# Patient Record
Sex: Male | Born: 1980
Health system: Southern US, Community
[De-identification: ages and names within clinical notes are randomized; demographics above are authoritative.]

## PROBLEM LIST (undated history)

## (undated) DIAGNOSIS — F411 Generalized anxiety disorder: Secondary | ICD-10-CM

## (undated) DIAGNOSIS — R002 Palpitations: Secondary | ICD-10-CM

## (undated) HISTORY — PX: OTHER SURGICAL HISTORY: SHX169

## (undated) HISTORY — DX: Generalized anxiety disorder: F41.1

## (undated) HISTORY — DX: Palpitations: R00.2

---

## 2008-08-27 ENCOUNTER — Encounter: Admission: RE | Admit: 2008-08-27 | Discharge: 2008-08-27 | Payer: Self-pay | Admitting: Occupational Medicine

## 2012-01-27 ENCOUNTER — Encounter (HOSPITAL_COMMUNITY): Payer: Self-pay | Admitting: *Deleted

## 2012-01-27 ENCOUNTER — Emergency Department (HOSPITAL_COMMUNITY)
Admission: EM | Admit: 2012-01-27 | Discharge: 2012-01-27 | Disposition: A | Discharge status: Home / Self Care | Payer: 59 | Attending: Emergency Medicine | Admitting: Emergency Medicine

## 2012-01-27 DIAGNOSIS — R110 Nausea: Secondary | ICD-10-CM | POA: Insufficient documentation

## 2012-01-27 DIAGNOSIS — R002 Palpitations: Secondary | ICD-10-CM | POA: Insufficient documentation

## 2012-01-27 LAB — POCT I-STAT, CHEM 8
BUN: 12 mg/dL (ref 6–23)
Calcium, Ion: 1.27 mmol/L — ABNORMAL HIGH (ref 1.12–1.23)
Chloride: 105 mEq/L (ref 96–112)
HCT: 49 % (ref 39.0–52.0)
Sodium: 140 mEq/L (ref 135–145)
TCO2: 22 mmol/L (ref 0–100)

## 2012-01-27 MED ORDER — ONDANSETRON HCL 4 MG/2ML IJ SOLN
4.0000 mg | Freq: Once | INTRAMUSCULAR | Status: AC
  Administered 2012-01-27: 4 mg via INTRAVENOUS
  Filled 2012-01-27: qty 2

## 2012-01-27 MED ORDER — SODIUM CHLORIDE 0.9 % IV BOLUS (SEPSIS)
1000.0000 mL | Freq: Once | INTRAVENOUS | Status: AC
  Administered 2012-01-27: 1000 mL via INTRAVENOUS

## 2012-01-27 NOTE — ED Notes (Signed)
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

## 2012-01-27 NOTE — ED Provider Notes (Addendum)
History     CSN: [id number]  Arrival date & time 01/27/12  1027   First MD Initiated Contact with Patient 01/27/12 1112      Chief Complaint  Patient presents with  . Palpitations  . Nausea    (Consider location/radiation/quality/duration/timing/severity/associated sxs/prior treatment) Patient is a 31 y.o. male presenting with palpitations. The history is provided by the patient.  Palpitations  Pertinent negatives include no fever, no chest pain, no abdominal pain, no nausea, no vomiting, no dizziness, no cough and no shortness of breath.   31 year old, male, with no significant past medical history presents to emergency department complaining of palpitations, and nausea.  He denies pain anywhere.  He denies actual vomiting.  He has not had diarrhea.  He has a mild increase in his temperature.  He denies cough, or shortness of breath.  He denies rash.  He does not smoke.  He does drink a lot of caffeinated beverages.  He occasionally drinks alcohol.  He is not taking any decongestants or other medications  History reviewed. No pertinent past medical history.  History reviewed. No pertinent past surgical history.  No family history on file.  History  Substance Use Topics  . Smoking status: Passive Smoker    Types: Pipe  . Smokeless tobacco: Not on file  . Alcohol Use: Yes     ocassionally      Review of Systems  Constitutional: Negative for fever and chills.  HENT: Negative for congestion and sore throat.   Respiratory: Negative for cough and shortness of breath.   Cardiovascular: Positive for palpitations. Negative for chest pain and leg swelling.  Gastrointestinal: Negative for nausea, vomiting, abdominal pain and diarrhea.  Skin: Negative for rash.  Neurological: Negative for dizziness and light-headedness.  Psychiatric/Behavioral: Negative for confusion.  All other systems reviewed and are negative.    Allergies  Review of patient's allergies indicates no known  allergies.  Home Medications   Current Outpatient Rx  Name Route Sig Dispense Refill  . CALCIUM CARBONATE ANTACID 500 MG PO CHEW Oral Chew 3 tablets by mouth 2 (two) times daily.      BP 117/86  Pulse 70  Temp 99.1 F (37.3 C)  Resp 20  Wt 270 lb (122.471 kg)  SpO2 96%  Physical Exam  Nursing note and vitals reviewed. Constitutional: He is oriented to person, place, and time. He appears well-developed and well-nourished. No distress.  HENT:  Head: Normocephalic and atraumatic.  Eyes: Conjunctivae are normal.  Neck: Normal range of motion. Neck supple.  Cardiovascular: Normal rate.   No murmur heard. Pulmonary/Chest: Effort normal and breath sounds normal. No respiratory distress.  Abdominal: Soft. He exhibits no distension. There is no tenderness.  Musculoskeletal: Normal range of motion.  Neurological: He is alert and oriented to person, place, and time.  Skin: Skin is warm and dry.  Psychiatric: He has a normal mood and affect. Thought content normal.    ED Course  Procedures (including critical care time) 31 year old, male, with no past medical history presents with palpitations, and nausea.  He drinks a lot of caffeinated beverages.  He takes no other medications.  Does not smoke.  His physical examination is normal.  We'll perform an EKG, and laboratory testing, for evaluation.  Labs Reviewed - No data to display No results found.   No diagnosis found.  12:23 PM He has not had palpitations in the ER, since he's been here.  He, says he just feels tired.  I explained the  findings/an EKG, recently.    Rate: 82  Rhythm: normal sinus rhythm  QRS Axis: normal  Intervals: normal  ST/T Wave abnormalities: normal  Conduction Disutrbances: none  Narrative Interpretation: unremarkable     MDM  Palpitations        Cheri Guppy, MD 01/27/12 1133  Cheri Guppy, MD 01/27/12 1223  Cheri Guppy, MD 02/23/12 [phone]

## 2012-01-28 ENCOUNTER — Telehealth: Payer: Self-pay | Admitting: Cardiovascular Disease

## 2012-01-28 NOTE — Telephone Encounter (Signed)
Pt seen in er last nigh for a-fib, says dc papers states to be seen asap, first available is 02-11-12 with dr Clifton James, is this too late?

## 2012-01-28 NOTE — Telephone Encounter (Signed)
02/11/2012 is fine.  His d/c instructions just said to follow-up with cardiologist.  His ER workup was normal.

## 2012-01-31 ENCOUNTER — Emergency Department (HOSPITAL_COMMUNITY)
Admission: EM | Admit: 2012-01-31 | Discharge: 2012-01-31 | Disposition: A | Discharge status: Home / Self Care | Payer: 59 | Attending: Emergency Medicine | Admitting: Emergency Medicine

## 2012-01-31 ENCOUNTER — Encounter (HOSPITAL_COMMUNITY): Payer: Self-pay | Admitting: Emergency Medicine

## 2012-01-31 ENCOUNTER — Emergency Department (HOSPITAL_COMMUNITY): Payer: 59

## 2012-01-31 DIAGNOSIS — F17200 Nicotine dependence, unspecified, uncomplicated: Secondary | ICD-10-CM | POA: Insufficient documentation

## 2012-01-31 DIAGNOSIS — R002 Palpitations: Secondary | ICD-10-CM | POA: Insufficient documentation

## 2012-01-31 HISTORY — PX: TRANSTHORACIC ECHOCARDIOGRAM: SHX275

## 2012-01-31 LAB — POCT I-STAT, CHEM 8
BUN: 17 mg/dL (ref 6–23)
Calcium, Ion: 1.2 mmol/L (ref 1.12–1.23)
Chloride: 104 mEq/L (ref 96–112)
HCT: 49 % (ref 39.0–52.0)
Potassium: 4.2 mEq/L (ref 3.5–5.1)

## 2012-01-31 LAB — CBC
HCT: 46.8 % (ref 39.0–52.0)
MCV: 88 fL (ref 78.0–100.0)
Platelets: 242 10*3/uL (ref 150–400)
RBC: 5.32 MIL/uL (ref 4.22–5.81)
RDW: 12.4 % (ref 11.5–15.5)
WBC: 8.2 10*3/uL (ref 4.0–10.5)

## 2012-01-31 LAB — POCT I-STAT TROPONIN I: Troponin i, poc: 0 ng/mL (ref 0.00–0.08)

## 2012-01-31 NOTE — ED Provider Notes (Signed)
History     CSN: [id number]  Arrival date & time 01/31/12  1747   First MD Initiated Contact with Patient 01/31/12 1845      Chief Complaint  Patient presents with  . Irregular Heart Beat    (Consider location/radiation/quality/duration/timing/severity/associated sxs/prior treatment) The history is provided by the patient.   patient is a healthy 31 year old male who presents the emergency room with a chief complaint of palpitations. This occurs nightly and last for several minutes. He was evaluated in the emergency department for this complaint several days ago with no acute findings and was advised to followup with a cardiologist but their next appointment is not until August 12. Today, he began to feel fluttering in the left side of his chest associated with weakness while at work. He came inside and sat down with some improvement but reports on arrival to the emergency department that he still feels a funny feeling on the left side of his chest. He denies that this is a pain. He denies any shortness of breath. He denies any nausea or vomiting. He denies any fever, chills, cough. Denies any modifying factors. No personal or known family history of DVT or PE. He is not a daily smoker. No recent prolonged immobility, surgery, travel. No pain or swelling to lower extremities. No early family history of coronary disease, though his mother does have a history of atrial fibrillation.  History reviewed. No pertinent past medical history.  History reviewed. No pertinent past surgical history.  No family history on file.  History  Substance Use Topics  . Smoking status: Passive Smoker    Types: Pipe  . Smokeless tobacco: Not on file  . Alcohol Use: Yes     ocassionally      Review of Systems 10 systems reviewed and are negative for acute change except as noted in the HPI.  Allergies  Review of patient's allergies indicates no known allergies.  Home Medications   Current Outpatient  Rx  Name Route Sig Dispense Refill  . CALCIUM CARBONATE ANTACID 500 MG PO CHEW Oral Chew 3 tablets by mouth 2 (two) times daily.      BP 132/65  Temp 98.2 F (36.8 C) (Oral)  Resp 15  SpO2 96%  Physical Exam  Nursing note and vitals reviewed. Constitutional: He is oriented to person, place, and time. He appears well-developed and well-nourished. No distress.  HENT:  Head: Normocephalic and atraumatic.  Right Ear: External ear normal.  Left Ear: External ear normal.       Oral mucosa moist  Eyes: Conjunctivae are normal.  Neck: Neck supple.       No carotid bruit heard  Cardiovascular: Normal rate, regular rhythm and normal heart sounds.        Bilateral radial pulses 2+  Pulmonary/Chest: Effort normal and breath sounds normal. No respiratory distress. He has no wheezes. He exhibits tenderness (over the left upper chest wall).  Abdominal: Soft. Bowel sounds are normal. He exhibits no distension. There is no tenderness.  Musculoskeletal: He exhibits no edema and no tenderness.  Neurological: He is alert and oriented to person, place, and time.       Moves all extremities well with preserved strength in major muscle groups, grip strength 5 out of 5. Speech is clear and appropriate.  Skin: Skin is warm and dry. He is not diaphoretic.    ED Course  Procedures (including critical care time)   Labs Reviewed  CBC  POCT I-STAT, CHEM 8  POCT I-STAT TROPONIN I   Dg Chest 2 View  01/31/2012  *RADIOLOGY REPORT*  Clinical Data: Palpitations and weakness.  CHEST - 2 VIEW  Comparison: 08/27/2008  Findings: Heart size is normal.  The lungs are free of focal consolidations and pleural effusions.  No edema. Visualized osseous structures have a normal appearance.  IMPRESSION: Negative exam.  Original Report Authenticated By: Patterson Hammersmith, M.D.    Date: 01/31/2012  Rate: 81  Rhythm: normal sinus rhythm  QRS Axis: normal  Intervals: normal  ST/T Wave abnormalities: normal  Conduction  Disutrbances: none  Old EKG Reviewed: compared to 01/27/12, No significant changes noted     Dx 1: palpitations   MDM  Palpitations, recurrent. 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 Given patient concerns, I have expanded his testing to include a chest x-ray, troponin, CBC and orthostatic vital signs. I-STAT chemistry panel and EKG are also repeated. EKG is unchanged from several days ago. Troponin is negative. CBC and i-STAT are unremarkable. Chest x-ray shows no acute findings. No episodes of palpitations in the emergency department and patient feels well on my reassessment. He will be discharged home. We discussed return precautions and he is to follow up with cardiologist as previously planned. He'll try an NSAID for his pain as there was tenderness to palpation of the chest wall.        Shaaron Adler, [place] 01/31/12 2032

## 2012-01-31 NOTE — ED Notes (Signed)
Today while at work started feeling "fluttering"in chest and "sick feeling" in left chest. States this has happened to him each night when going to bed. Was here last Sunday for same. Denies SOB. Denies nausea or vomiting.

## 2012-02-01 NOTE — ED Provider Notes (Signed)
Medical screening examination/treatment/procedure(s) were performed by non-physician practitioner and as supervising physician I was immediately available for consultation/collaboration.  Noami Bove, MD 02/01/12 0145 

## 2012-02-11 ENCOUNTER — Ambulatory Visit (INDEPENDENT_AMBULATORY_CARE_PROVIDER_SITE_OTHER): Payer: 59 | Admitting: Cardiovascular Disease

## 2012-02-11 ENCOUNTER — Encounter: Payer: Self-pay | Admitting: Cardiovascular Disease

## 2012-02-11 VITALS — BP 128/79 | HR 78 | Ht 74.0 in | Wt 272.0 lb

## 2012-02-11 DIAGNOSIS — R002 Palpitations: Secondary | ICD-10-CM | POA: Insufficient documentation

## 2012-02-11 NOTE — Assessment & Plan Note (Signed)
Likely premature beats. Will check TSH (BMET ok last week), echo to exclude structural heart disease and have him wear a 48 hour monitor. Avoid stimulants such as caffeine, nicotine, over the counter stimulants.

## 2012-02-11 NOTE — Patient Instructions (Addendum)
Your physician recommends that you schedule a follow-up appointment in: 3 weeks   Your physician has requested that you have an echocardiogram. Echocardiography is a painless test that uses sound waves to create images of your heart. It provides your doctor with information about the size and shape of your heart and how well your heart's chambers and valves are working. This procedure takes approximately one hour. There are no restrictions for this procedure.  Your physician has recommended that you wear a holter monitor. Holter monitors are medical devices that record the heart's electrical activity. Doctors most often use these monitors to diagnose arrhythmias. Arrhythmias are problems with the speed or rhythm of the heartbeat. The monitor is a small, portable device. You can wear one while you do your normal daily activities. This is usually used to diagnose what is causing palpitations/syncope (passing out).    

## 2012-02-11 NOTE — Progress Notes (Signed)
   History of Present Illness: 31 yo male with history of palpitations but no chronic medical problems who is here today as a new patient for evaluation of palpitations. He tells me that 1 month ago, he felt his heart fluttering. This is a funny sensation in his upper chest. This lasted for a few minutes. 3 weeks ago, he had the same feeling and this lasted all night. He had no stimulants during the day. He has had recurrence of symptoms every night over the last week. No near syncope or syncope. No chest pain or affect on breathing. He is a Chemical engineer.   Primary Care Physician: None  Past Medical History  Diagnosis Date  . Palpitations     Past Surgical History  Procedure Date  . None     No current outpatient prescriptions on file.    No Known Allergies  History   Social History  . Marital Status: Unknown    Spouse Name: N/A    Number of Children: 0  . Years of Education: N/A   Occupational History  . [employer]   Social History Main Topics  . Smoking status: Passive Smoker    Types: Pipe  . Smokeless tobacco: Not on file   Comment: pt occ smokes a pipe maybe once or twice a month  . Alcohol Use: 2.5 oz/week    5 drink(s) per week     ocassionally  . Drug Use: No  . Sexually Active: Not on file   Other Topics Concern  . Not on file   Social History Narrative  . No narrative on file    Family History  Problem Relation Age of Onset  . Cancer Father     Lung cancer    Review of Systems:  As stated in the HPI and otherwise negative.   BP 128/79  Pulse 78  Ht 6\' 2"  (1.88 m)  Wt 272 lb (123.378 kg)  BMI 34.92 kg/m2  Physical Examination: General: Well developed, well nourished, NAD HEENT: OP clear, mucus membranes moist SKIN: warm, dry. No rashes. Neuro: No focal deficits Musculoskeletal: Muscle strength 5/5 all ext Psychiatric: Mood and affect normal Neck: No JVD, no carotid bruits, no thyromegaly, no  lymphadenopathy. Lungs:Clear bilaterally, no wheezes, rhonci, crackles Cardiovascular: Regular rate and rhythm. No murmurs, gallops or rubs. Abdomen:Soft. Bowel sounds present. Non-tender.  Extremities: No lower extremity edema. Pulses are 2 + in the bilateral DP/PT.  EKG: NSR, rate 78 bpm. Normal EKG

## 2012-02-13 ENCOUNTER — Other Ambulatory Visit (HOSPITAL_COMMUNITY): Payer: 59

## 2012-02-15 ENCOUNTER — Encounter: Payer: Self-pay | Admitting: *Deleted

## 2012-02-15 ENCOUNTER — Ambulatory Visit (HOSPITAL_COMMUNITY): Payer: 59 | Attending: Cardiovascular Disease | Admitting: Radiology

## 2012-02-15 ENCOUNTER — Encounter (INDEPENDENT_AMBULATORY_CARE_PROVIDER_SITE_OTHER): Payer: 59

## 2012-02-15 DIAGNOSIS — R002 Palpitations: Secondary | ICD-10-CM

## 2012-02-15 DIAGNOSIS — I079 Rheumatic tricuspid valve disease, unspecified: Secondary | ICD-10-CM | POA: Insufficient documentation

## 2012-02-15 DIAGNOSIS — I059 Rheumatic mitral valve disease, unspecified: Secondary | ICD-10-CM | POA: Insufficient documentation

## 2012-02-15 NOTE — Progress Notes (Signed)
Echocardiogram performed.  

## 2012-03-04 ENCOUNTER — Ambulatory Visit (INDEPENDENT_AMBULATORY_CARE_PROVIDER_SITE_OTHER): Payer: 59 | Admitting: Cardiovascular Disease

## 2012-03-04 ENCOUNTER — Encounter: Payer: Self-pay | Admitting: Cardiovascular Disease

## 2012-03-04 VITALS — BP 122/84 | HR 78 | Resp 19 | Ht 74.0 in | Wt 277.4 lb

## 2012-03-04 DIAGNOSIS — R002 Palpitations: Secondary | ICD-10-CM

## 2012-03-04 NOTE — Progress Notes (Signed)
   History of Present Illness: 31 yo male with history of palpitations but no chronic medical problems who is here today for cardiac follow up. I saw him as a new patient for evaluation of palpitations 02/11/12. He told me that he had been feeling his heart fluttering for one month.  This lasted for a few minutes.  No near syncope or syncope. No chest pain or affect on breathing. He is a Chemical engineer. I arranged a 48 hour monitor which showed NSR with PACs and several episodes of Mobitz 1 AV block.    He has been feeling better. Occasional palpitations. No sustained episodes. No syncope or near syncope.   Primary Care Physician: None  Past Medical History  Diagnosis Date  . Palpitations     Past Surgical History  Procedure Date  . None     No current outpatient prescriptions on file.    No Known Allergies  History   Social History  . Marital Status: Unknown    Spouse Name: N/A    Number of Children: 0  . Years of Education: N/A   Occupational History  . [employer]   Social History Main Topics  . Smoking status: Passive Smoker    Types: Pipe  . Smokeless tobacco: Not on file   Comment: pt occ smokes a pipe maybe once or twice a month  . Alcohol Use: 2.5 oz/week    5 drink(s) per week     ocassionally  . Drug Use: No  . Sexually Active: Not on file   Other Topics Concern  . Not on file   Social History Narrative  . No narrative on file    Family History  Problem Relation Age of Onset  . Cancer Father     Lung cancer    Review of Systems:  As stated in the HPI and otherwise negative.   There were no vitals taken for this visit.  Physical Examination: General: Well developed, well nourished, NAD HEENT: OP clear, mucus membranes moist SKIN: warm, dry. No rashes. Neuro: No focal deficits Musculoskeletal: Muscle strength 5/5 all ext Psychiatric: Mood and affect normal Neck: No JVD, no carotid bruits, no thyromegaly, no  lymphadenopathy. Lungs:Clear bilaterally, no wheezes, rhonci, crackles Cardiovascular: Regular rate and rhythm. No murmurs, gallops or rubs. Abdomen:Soft. Bowel sounds present. Non-tender.  Extremities: No lower extremity edema. Pulses are 2 + in the bilateral DP/PT.  Echo 02/15/12: Left ventricle: The cavity size was normal. Wall thickness was normal. Systolic function was normal. The estimated ejection fraction was in the range of 55% to 60%. Features are consistent with a pseudonormal left ventricular filling pattern, with concomitant abnormal relaxation and increased filling pressure (grade 2 diastolic dysfunction)   Assessment and Plan:   1. Palpitations: Most likely secondary to PACs. I do not think his symptoms are related to his AV block. The AV block is isolated.  LV function is normal. No further workup.

## 2012-03-04 NOTE — Patient Instructions (Addendum)
Your physician recommends that you schedule a follow-up appointment as needed with Dr. McAlhany   

## 2013-04-20 ENCOUNTER — Telehealth: Payer: Self-pay

## 2013-04-20 NOTE — Telephone Encounter (Signed)
Medication List and allergies: reviewed  Takes no Rx medications at this time  90 day supply/mail order: none Local prescriptions: [facility] [address]  Immunizations due: flu, tdap declined  A/P:    ROI to [facility] [address] [place]  To Discuss with Provider: Patient has lots of thing he would like to tell the provider Wants to do in person since its a long story.

## 2013-04-22 ENCOUNTER — Ambulatory Visit (INDEPENDENT_AMBULATORY_CARE_PROVIDER_SITE_OTHER): Payer: 59 | Admitting: Family Medicine

## 2013-04-22 ENCOUNTER — Encounter: Payer: Self-pay | Admitting: Family Medicine

## 2013-04-22 VITALS — BP 122/80 | HR 77 | Temp 98.5°F | Ht 73.5 in | Wt 270.6 lb

## 2013-04-22 DIAGNOSIS — M546 Pain in thoracic spine: Secondary | ICD-10-CM

## 2013-04-22 MED ORDER — CELECOXIB 200 MG PO CAPS: 200.0000 mg | ORAL_CAPSULE | Freq: Every day | ORAL | Status: DC

## 2013-04-22 NOTE — Patient Instructions (Signed)
Thoracic Strain  You have injured the muscles or tendons that attach to the upper part of your back behind your chest. This injury is called a thoracic strain, thoracic sprain, or mid-back strain.   CAUSES   The cause of thoracic strain varies. A less severe injury involves pulling a muscle or tendon without tearing it. A more severe injury involves tearing (rupturing) a muscle or tendon. With less severe injuries, there may be little loss of strength. Sometimes, there are breaks (fractures) in the bones to which the muscles are attached. These fractures are rare, unless there was a direct hit (trauma) or you have weak bones due to osteoporosis or age. Longstanding strains may be caused by overuse or improper form during certain movements. Obesity can also increase your risk for back injuries. Sudden strains may occur due to injury or not warming up properly before exercise. Often, there is no obvious cause for a thoracic strain.  SYMPTOMS   The main symptom is pain, especially with movement, such as during exercise.  DIAGNOSIS   Your caregiver can usually tell what is wrong by taking an X-ray and doing a physical exam.  TREATMENT    Physical therapy may be helpful for recovery. Your caregiver can give you exercises to do or refer you to a physical therapist after your pain improves.   After your pain improves, strengthening and conditioning programs appropriate for your sport or occupation may be helpful.   Always warm up before physical activities or athletics. Stretching after physical activity may also help.   Certain over-the-counter medicines may also help. Ask your caregiver if there are medicines that would help you.  If this is your first thoracic strain injury, proper care and proper healing time before starting activities should prevent long-term problems. Torn ligaments and tendons require as long to heal as broken bones. Average healing times may be only 1 week for a mild strain. For torn muscles  and tendons, healing time may be up to 6 weeks to 2 months.  HOME CARE INSTRUCTIONS    Apply ice to the injured area. Ice massages may also be used as directed.   Put ice in a plastic bag.   Place a towel between your skin and the bag.   Leave the ice on for 15-20 minutes, 3-4 times a day, for the first 2 days.   Only take over-the-counter or prescription medicines for pain, discomfort, or fever as directed by your caregiver.   Keep your appointments for physical therapy if this was prescribed.   Use wraps and back braces as instructed.  SEEK IMMEDIATE MEDICAL CARE IF:    You have an increase in bruising, swelling, or pain.   Your pain has not improved with medicines.   You develop new shortness of breath, chest pain, or fever.   Problems seem to be getting worse rather than better.  MAKE SURE YOU:    Understand these instructions.   Will watch your condition.   Will get help right away if you are not doing well or get worse.  Document Released: 09/08/2003 Document Revised: 09/10/2011 Document Reviewed: 08/04/2010  Exit[employer] Patient Information 2014 [employer].

## 2013-04-22 NOTE — Progress Notes (Signed)
  Subjective:    Levi Berger is a 32 y.o. male who presents for evaluation of low back pain. The patient has had no prior back problems. Symptoms have been present for several months and are gradually worsening.  Onset was related to / precipitated by no known injury. The pain is located in the interscapular area and does not radiate. The pain is described as aching and sharp and occurs all day. He rates his pain as severe. Symptoms are exacerbated by lying down, sitting and twisting. Symptoms are improved by ice and NSAIDs. He has also tried nothing which provided no symptom relief. He has no other symptoms associated with the back pain. The patient has no "red flag" history indicative of complicated back pain. Pt has been to UC 2 x and chiropractor.  The following portions of the patient's history were reviewed and updated as appropriate:  He  has a past medical history of Palpitations. He  does not have any pertinent problems on file. He  has past surgical history that includes None. His family history includes Alcohol abuse in his maternal grandfather; Arthritis in his paternal grandfather; Colon cancer in his paternal grandfather; Diabetes in his father and paternal grandfather; Heart disease in his maternal grandfather; Hypertension in his father; Lung cancer in his father. He  reports that he has been passively smoking Pipe.  He does not have any smokeless tobacco history on file. He reports that he drinks about 2.5 ounces of alcohol per week. He reports that he does not use illicit drugs. He has a current medication list which includes the following prescription(s): celecoxib. No current outpatient prescriptions on file prior to visit.   No current facility-administered medications on file prior to visit.   He has No Known Allergies..  Review of Systems Pertinent items are noted in HPI.    Objective:   Inspection and palpation: scoliosis noted right scapula raised when pt bends  forward. Muscle tone and ROM exam: full range of motion with pain. Neurological: normal DTRs, muscle strength and reflexes.    Assessment:    thoracic back pain-- + scoliosis    Plan:    Short (2-4 day) period of relative rest recommended until acute symptoms improve. Ice to affected area as needed for local pain relief. Heat to affected area as needed for local pain relief. NSAIDs per medication orders. Orthopedic referral due to worsening of pain.

## 2014-06-02 ENCOUNTER — Ambulatory Visit (INDEPENDENT_AMBULATORY_CARE_PROVIDER_SITE_OTHER): Payer: 59 | Admitting: Medical

## 2014-06-02 VITALS — BP 135/91 | HR 77 | Temp 99.2°F | Ht 73.5 in | Wt 274.2 lb

## 2014-06-02 DIAGNOSIS — M5432 Sciatica, left side: Secondary | ICD-10-CM

## 2014-06-02 DIAGNOSIS — M5430 Sciatica, unspecified side: Secondary | ICD-10-CM | POA: Insufficient documentation

## 2014-06-02 MED ORDER — DICLOFENAC SODIUM 75 MG PO TBEC: 75.0000 mg | DELAYED_RELEASE_TABLET | Freq: Two times a day (BID) | ORAL | Status: DC

## 2014-06-02 MED ORDER — CYCLOBENZAPRINE HCL 5 MG PO TABS: 5.0000 mg | ORAL_TABLET | Freq: Every day | ORAL | Status: DC

## 2014-06-02 NOTE — Assessment & Plan Note (Addendum)
I have sent diclofenac nsaid(dc other rx nsaids or otc nsaids) and  Take cyclobenzaprene muscle relaxant. Try to rest and stretch back.  If pain persist in one week notify us and may give prednisone and get lumbar xray.  If we have to try prednisone and still not improving would refer to PT.  Note  Lt hip- on range of motion of hip and palpation over hip itelf no pain of the hip.

## 2014-06-02 NOTE — Patient Instructions (Addendum)
I have sent diclofenac nsaid(dc other rx nsaids or otc nsaids) and cyclobenzaprene muscle relaxant. Try to rest and stretch back.  If pain persist in one week notify us and may give prednisone and get lumbar xray.  If we have to try prednisone and still not improving would refer to PT.  Follow up in 10-14 days or as needed.   Sciatica Sciatica is pain, weakness, numbness, or tingling along the path of the sciatic nerve. The nerve starts in the lower back and runs down the back of each leg. The nerve controls the muscles in the lower leg and in the back of the knee, while also providing sensation to the back of the thigh, lower leg, and the sole of your foot. Sciatica is a symptom of another medical condition. For instance, nerve damage or certain conditions, such as a herniated disk or bone spur on the spine, pinch or put pressure on the sciatic nerve. This causes the pain, weakness, or other sensations normally associated with sciatica. Generally, sciatica only affects one side of the body. CAUSES   Herniated or slipped disc.  Degenerative disk disease.  A pain disorder involving the narrow muscle in the buttocks (piriformis syndrome).  Pelvic injury or fracture.  Pregnancy.  Tumor (rare). SYMPTOMS  Symptoms can vary from mild to very severe. The symptoms usually travel from the low back to the buttocks and down the back of the leg. Symptoms can include:  Mild tingling or dull aches in the lower back, leg, or hip.  Numbness in the back of the calf or sole of the foot.  Burning sensations in the lower back, leg, or hip.  Sharp pains in the lower back, leg, or hip.  Leg weakness.  Severe back pain inhibiting movement. These symptoms may get worse with coughing, sneezing, laughing, or prolonged sitting or standing. Also, being overweight may worsen symptoms. DIAGNOSIS  Your caregiver will perform a physical exam to look for common symptoms of sciatica. He or she may ask you to  do certain movements or activities that would trigger sciatic nerve pain. Other tests may be performed to find the cause of the sciatica. These may include:  Blood tests.  X-rays.  Imaging tests, such as an MRI or CT scan. TREATMENT  Treatment is directed at the cause of the sciatic pain. Sometimes, treatment is not necessary and the pain and discomfort goes away on its own. If treatment is needed, your caregiver may suggest:  Over-the-counter medicines to relieve pain.  Prescription medicines, such as anti-inflammatory medicine, muscle relaxants, or narcotics.  Applying heat or ice to the painful area.  Steroid injections to lessen pain, irritation, and inflammation around the nerve.  Reducing activity during periods of pain.  Exercising and stretching to strengthen your abdomen and improve flexibility of your spine. Your caregiver may suggest losing weight if the extra weight makes the back pain worse.  Physical therapy.  Surgery to eliminate what is pressing or pinching the nerve, such as a bone spur or part of a herniated disk. HOME CARE INSTRUCTIONS   Only take over-the-counter or prescription medicines for pain or discomfort as directed by your caregiver.  Apply ice to the affected area for 20 minutes, 3-4 times a day for the first 48-72 hours. Then try heat in the same way.  Exercise, stretch, or perform your usual activities if these do not aggravate your pain.  Attend physical therapy sessions as directed by your caregiver.  Keep all follow-up appointments as directed  by your caregiver.  Do not wear high heels or shoes that do not provide proper support.  Check your mattress to see if it is too soft. A firm mattress may lessen your pain and discomfort. SEEK IMMEDIATE MEDICAL CARE IF:   You lose control of your bowel or bladder (incontinence).  You have increasing weakness in the lower back, pelvis, buttocks, or legs.  You have redness or swelling of your  back.  You have a burning sensation when you urinate.  You have pain that gets worse when you lie down or awakens you at night.  Your pain is worse than you have experienced in the past.  Your pain is lasting longer than 4 weeks.  You are suddenly losing weight without reason. MAKE SURE YOU:  Understand these instructions.  Will watch your condition.  Will get help right away if you are not doing well or get worse. Document Released: 06/12/2001 Document Revised: 12/18/2011 Document Reviewed: 10/28/2011 [facility] Patient Information 2015 [place] [place]. This information is not intended to replace advice given to you by your health care provider. Make sure you discuss any questions you have with your health care provider.

## 2014-06-02 NOTE — Progress Notes (Signed)
Pre visit review using our clinic review tool, if applicable. No additional management support is needed unless otherwise documented below in the visit note. 

## 2014-06-02 NOTE — Progress Notes (Signed)
Subjective:    Patient ID: Levi Berger, male    DOB: [birth year], 33 y.o.   MRN: [id number]  HPI   Pt in with Lt si pain. Pain on the left side. Pt states started 2 months ago. Mild at first but getting worse. Position dependent and with activity. Worse with walking down pain is worse. Standing in place and shifting quickly will cause. No pain prior to 2 months ago. Seated no pain. Pt takes ibupofen for pain. It does not help much.  Pt has some upper back pain history. Pt had work up for his upper back before. He had mri in the past. He is not here for the upper back. But he explained history upon my inquiring.  Pt has no  Mid lumbar back pain. Note on physical exam area of pain is in lt si area although he states hip. But on exam no hip pain.  Past Medical History  Diagnosis Date  . Palpitations     History   Social History  . Marital Status: Unknown    Spouse Name: N/A    Number of Children: 0  . Years of Education: N/A   Occupational History  . [employer] Unemployed   Social History Main Topics  . Smoking status: Passive Smoke Exposure - Never Smoker    Types: Pipe  . Smokeless tobacco: Not on file     Comment: pt occ smokes a pipe maybe once or twice a month  . Alcohol Use: 2.5 oz/week    5 drink(s) per week     Comment: ocassionally  . Drug Use: No  . Sexual Activity: Not on file   Other Topics Concern  . Not on file   Social History Narrative  . No narrative on file    Past Surgical History  Procedure Laterality Date  . None      Family History  Problem Relation Age of Onset  . Lung cancer Father     Smoker  . Hypertension Father   . Arthritis Paternal Grandfather   . Colon cancer Paternal Grandfather   . Heart disease Maternal Grandfather   . Alcohol abuse Maternal Grandfather   . Diabetes Father   . Diabetes Paternal Grandfather     No Known Allergies  Current Outpatient Prescriptions on File Prior to Visit  Medication Sig Dispense  Refill  . celecoxib (CELEBREX) 200 MG capsule Take 1 capsule (200 mg total) by mouth daily. 30 capsule 3   No current facility-administered medications on file prior to visit.    BP 135/91 mmHg  Pulse 77  Temp(Src) 99.2 F (37.3 C) (Oral)  Ht 6' 1.5" (1.867 m)  Wt 274 lb 3.2 oz (124.376 kg)  BMI 35.68 kg/m2  SpO2 97%       Review of Systems  Constitutional: Negative for fever, chills and fatigue.  Respiratory: Negative for cough, shortness of breath and wheezing.   Genitourinary: Negative for urgency, frequency, hematuria, flank pain, enuresis and difficulty urinating.  Musculoskeletal: Positive for back pain.       Actually lt si area pain.  Neurological: Negative for weakness and numbness.  Hematological: Negative for adenopathy. Does not bruise/bleed easily.       Objective:   Physical Exam   General Appearance- Not in acute distress.    Chest and Lung Exam Auscultation: Breath sounds:-Normal. Clear even and unlabored. Adventitious sounds:- No Adventitious sounds.  Cardiovascular Auscultation:Rythm - Regular, rate and rythm. Heart Sounds -Normal heart sounds.  Abdomen  Inspection:-Inspection Normal.  Palpation/Perucssion: Palpation and Percussion of the abdomen reveal- Non Tender, No Rebound tenderness, No rigidity(Guarding) and No Palpable abdominal masses.  Liver:-Normal.  Spleen:- Normal.   Back No Mid lumbar spine tenderness to palpation. Pain on straight leg lift. Pain on lateral movements and flexion/extension of the spine. On palpation the pain is directly in the lt si area.  Lower ext neurologic  L5-S1 sensation intact bilaterally. Normal patellar reflexes bilaterally. No foot drop bilaterally.        Assessment & Plan:

## 2015-09-22 ENCOUNTER — Encounter (HOSPITAL_COMMUNITY): Payer: Self-pay

## 2015-09-22 ENCOUNTER — Emergency Department (HOSPITAL_COMMUNITY)
Admission: EM | Admit: 2015-09-22 | Discharge: 2015-09-22 | Disposition: A | Discharge status: Home / Self Care | Payer: 59 | Attending: Emergency Medicine | Admitting: Emergency Medicine

## 2015-09-22 DIAGNOSIS — R531 Weakness: Secondary | ICD-10-CM | POA: Diagnosis not present

## 2015-09-22 DIAGNOSIS — R42 Dizziness and giddiness: Secondary | ICD-10-CM | POA: Diagnosis not present

## 2015-09-22 DIAGNOSIS — G8929 Other chronic pain: Secondary | ICD-10-CM | POA: Diagnosis not present

## 2015-09-22 DIAGNOSIS — F419 Anxiety disorder, unspecified: Secondary | ICD-10-CM | POA: Diagnosis not present

## 2015-09-22 DIAGNOSIS — R231 Pallor: Secondary | ICD-10-CM | POA: Diagnosis not present

## 2015-09-22 DIAGNOSIS — R112 Nausea with vomiting, unspecified: Secondary | ICD-10-CM | POA: Diagnosis present

## 2015-09-22 DIAGNOSIS — M549 Dorsalgia, unspecified: Secondary | ICD-10-CM | POA: Insufficient documentation

## 2015-09-22 LAB — URINALYSIS, ROUTINE W REFLEX MICROSCOPIC
Bilirubin Urine: NEGATIVE
Glucose, UA: NEGATIVE mg/dL
Hgb urine dipstick: NEGATIVE
KETONES UR: NEGATIVE mg/dL
LEUKOCYTES UA: NEGATIVE
NITRITE: NEGATIVE
PH: 6.5 (ref 5.0–8.0)
PROTEIN: NEGATIVE mg/dL
Specific Gravity, Urine: 1.015 (ref 1.005–1.030)

## 2015-09-22 LAB — CBC
HEMATOCRIT: 44.5 % (ref 39.0–52.0)
Hemoglobin: 15.2 g/dL (ref 13.0–17.0)
MCH: 31.2 pg (ref 26.0–34.0)
MCHC: 34.2 g/dL (ref 30.0–36.0)
MCV: 91.4 fL (ref 78.0–100.0)
PLATELETS: 210 10*3/uL (ref 150–400)
RBC: 4.87 MIL/uL (ref 4.22–5.81)
RDW: 12.5 % (ref 11.5–15.5)
WBC: 7.6 10*3/uL (ref 4.0–10.5)

## 2015-09-22 LAB — BASIC METABOLIC PANEL
ANION GAP: 11 (ref 5–15)
BUN: 19 mg/dL (ref 6–20)
CO2: 21 mmol/L — ABNORMAL LOW (ref 22–32)
Calcium: 9.5 mg/dL (ref 8.9–10.3)
Chloride: 105 mmol/L (ref 101–111)
Creatinine, Ser: 1.29 mg/dL — ABNORMAL HIGH (ref 0.61–1.24)
GFR calc Af Amer: >60 mL/min (ref >60)
Glucose, Bld: 119 mg/dL — ABNORMAL HIGH (ref 65–99)
POTASSIUM: 3.5 mmol/L (ref 3.5–5.1)
SODIUM: 137 mmol/L (ref 135–145)

## 2015-09-22 LAB — CBG MONITORING, ED: GLUCOSE-CAPILLARY: 103 mg/dL — AB (ref 65–99)

## 2015-09-22 LAB — I-STAT TROPONIN, ED: TROPONIN I, POC: 0 ng/mL (ref 0.00–0.08)

## 2015-09-22 MED ORDER — LORAZEPAM 2 MG/ML IJ SOLN
1.0000 mg | Freq: Once | INTRAMUSCULAR | Status: AC
  Administered 2015-09-22: 1 mg via INTRAVENOUS
  Filled 2015-09-22: qty 1

## 2015-09-22 NOTE — Discharge Instructions (Signed)
You were examined for acute onset  Nausea, vomiting and lightheadedness which has been evaluated, you were found to have normal labs, normal EKG, negative cardiac enzymes and stable vital signs.  You were given antiemetics as well and IV fluids   If at any time your symptoms return, you develop a rash or weakness please return for further evaluation   Dizziness Dizziness is a common problem. It makes you feel unsteady or lightheaded. You may feel like you are about to pass out (faint). Dizziness can lead to injury if you stumble or fall. Anyone can get dizzy, but dizziness is more common in older adults. This condition can be caused by a number of things, including:  Medicines.  Dehydration.  Illness. HOME CARE Following these instructions may help with your condition: Eating and Drinking  Drink enough fluid to keep your pee (urine) clear or pale yellow. This helps to keep you from getting dehydrated. Try to drink more clear fluids, such as water.  Do not drink alcohol.  Limit how much caffeine you drink or eat if told by your doctor.  Limit how much salt you drink or eat if told by your doctor. Activity  Avoid making quick movements.  When you stand up from sitting in a chair, steady yourself until you feel okay.  In the morning, first sit up on the side of the bed. When you feel okay, stand slowly while you hold onto something. Do this until you know that your balance is fine.  Move your legs often if you need to stand in one place for a long time. Tighten and relax your muscles in your legs while you are standing.  Do not drive or use heavy machinery if you feel dizzy.  Avoid bending down if you feel dizzy. Place items in your home so that they are easy for you to reach without leaning over. Lifestyle  Do not use any tobacco products, including cigarettes, chewing tobacco, or electronic cigarettes. If you need help quitting, ask your doctor.  Try to lower your stress level,  such as with yoga or meditation. Talk with your doctor if you need help. General Instructions  Watch your dizziness for any changes.  Take medicines only as told by your doctor. Talk with your doctor if you think that your dizziness is caused by a medicine that you are taking.  Tell a friend or a family member that you are feeling dizzy. If he or she notices any changes in your behavior, have this person call your doctor.  Keep all follow-up visits as told by your doctor. This is important. GET HELP IF:  Your dizziness does not go away.  Your dizziness or light-headedness gets worse.  You feel sick to your stomach (nauseous).  You have trouble hearing.  You have new symptoms.  You are unsteady on your feet or you feel like the room is spinning. GET HELP RIGHT AWAY IF:  You throw up (vomit) or have diarrhea and are unable to eat or drink anything.  You have trouble:  Talking.  Walking.  Swallowing.  Using your arms, hands, or legs.  You feel generally weak.  You are not thinking clearly or you have trouble forming sentences. It may take a friend or family member to notice this.  You have:  Chest pain.  Pain in your belly (abdomen).  Shortness of breath.  Sweating.  Your vision changes.  You are bleeding.  You have a headache.  You have neck pain  or a stiff neck.  You have a fever.   This information is not intended to replace advice given to you by your health care provider. Make sure you discuss any questions you have with your health care provider.   Document Released: 06/07/2011 Document Revised: 11/02/2014 Document Reviewed: 06/14/2014 Elsevier Interactive Patient Education [employer]

## 2015-09-22 NOTE — ED Notes (Signed)
EKG given to EDP,Nanavati,MD., for review. 

## 2015-09-22 NOTE — ED Notes (Signed)
Pt ambulated without difficulty

## 2015-09-22 NOTE — ED Notes (Signed)
Pt. Reporting periodic tingling in extremities bilaterally

## 2015-09-22 NOTE — ED Notes (Signed)
Bed: [id number] Expected date:  Expected time:  Means of arrival:  Comments: dizziness

## 2015-09-22 NOTE — ED Provider Notes (Signed)
CSN: [id number]     Arrival date & time 09/22/15  0117 History   First MD Initiated Contact with Patient 09/22/15 0203     Chief Complaint  Patient presents with  . Weakness  . Nausea     (Consider location/radiation/quality/duration/timing/severity/associated sxs/prior Treatment) HPI Comments: This is a normally healthy 35 year old male who states he was at work this evening when he started having waves of nausea and lightheadedness states he is not disease not having any visual disturbances have any extremities noted difficulty ambulating. He did have one episode of vomiting which did not alleviate his symptoms. He was transported via EMS he was given IV Zofran which she states has helped the nausea slightly but not the episodes of lightheadedness. During my interview he had several episodes where he had to stop for second stating there was it lasts seconds is also complaining of bilateral hand numbness.  Patient is a 35 y.o. male presenting with weakness. The history is provided by the patient.  Weakness This is a new problem. The current episode started today. The problem occurs intermittently. The problem has been unchanged. Associated symptoms include nausea, vomiting and weakness. Pertinent negatives include no abdominal pain, chest pain, chills, coughing, fever or headaches. Nothing aggravates the symptoms. He has tried nothing for the symptoms. The treatment provided no relief.    Past Medical History  Diagnosis Date  . Palpitations    Past Surgical History  Procedure Laterality Date  . None     Family History  Problem Relation Age of Onset  . Lung cancer Father     Smoker  . Hypertension Father   . Arthritis Paternal Grandfather   . Colon cancer Paternal Grandfather   . Heart disease Maternal Grandfather   . Alcohol abuse Maternal Grandfather   . Diabetes Father   . Diabetes Paternal Grandfather    Social History  Substance Use Topics  . Smoking status: Passive Smoke  Exposure - Never Smoker    Types: Pipe  . Smokeless tobacco: None     Comment: pt occ smokes a pipe maybe once or twice a month  . Alcohol Use: 2.5 oz/week    5 drink(s) per week     Comment: ocassionally    Review of Systems  Constitutional: Negative for fever and chills.  HENT: Negative for rhinorrhea.   Respiratory: Negative for cough and shortness of breath.   Cardiovascular: Negative for chest pain.  Gastrointestinal: Positive for nausea and vomiting. Negative for abdominal pain.  Musculoskeletal: Positive for back pain.       Chronic back pain   Neurological: Positive for weakness and light-headedness. Negative for dizziness, facial asymmetry and headaches.  Psychiatric/Behavioral: The patient is nervous/anxious.   All other systems reviewed and are negative.     Allergies  Review of patient's allergies indicates no known allergies.  Home Medications   Prior to Admission medications   Medication Sig Start Date End Date Taking? Authorizing Provider  ibuprofen (ADVIL,MOTRIN) 200 MG tablet Take 400 mg by mouth every 6 (six) hours as needed for moderate pain.   Yes Historical Provider, MD  celecoxib (CELEBREX) 200 MG capsule Take 1 capsule (200 mg total) by mouth daily. Patient not taking: Reported on 09/22/2015 04/22/13   Lelon Perla, DO  cyclobenzaprine (FLEXERIL) 5 MG tablet Take 1 tablet (5 mg total) by mouth at bedtime. Patient not taking: Reported on 09/22/2015 06/02/14   Esperanza Richters, PA-C  diclofenac (VOLTAREN) 75 MG EC tablet Take 1 tablet (75  mg total) by mouth 2 (two) times daily. Patient not taking: Reported on 09/22/2015 06/02/14   Esperanza RichtersEdward Saguier, PA-C   BP 118/96 mmHg  Pulse 78  Temp(Src) 97.7 F (36.5 C) (Oral)  Resp 12  SpO2 100% Physical Exam  Constitutional: He is oriented to person, place, and time. He appears well-developed and well-nourished.  HENT:  Head: Normocephalic.  Mouth/Throat: Oropharynx is clear and moist.  Eyes: Pupils are equal,  round, and reactive to light.  Neck: Normal range of motion.  Cardiovascular: Normal rate and regular rhythm.   Pulmonary/Chest: Effort normal and breath sounds normal.  Abdominal: Soft. Bowel sounds are normal.  Musculoskeletal: Normal range of motion.  Neurological: He is alert and oriented to person, place, and time.  Skin: There is pallor.  Psychiatric: His mood appears anxious.  Nursing note and vitals reviewed.   ED Course  Procedures (including critical care time) Labs Review Labs Reviewed  BASIC METABOLIC PANEL - Abnormal; Notable for the following:    CO2 21 (*)    Glucose, Bld 119 (*)    Creatinine, Ser 1.29 (*)    All other components within normal limits  CBG MONITORING, ED - Abnormal; Notable for the following:    Glucose-Capillary 103 (*)    All other components within normal limits  CBC  URINALYSIS, ROUTINE W REFLEX MICROSCOPIC ([facility])  I-STAT TROPOININ, ED    Imaging Review No results found. I have personally reviewed and evaluated these images and lab results as part of my medical decision-making.   EKG Interpretation   Date/Time:  Thursday September 22 2015 01:24:34 EDT Ventricular Rate:  93 PR Interval:  206 QRS Duration: 93 QT Interval:  360 QTC Calculation: 448 R Axis:   51 Text Interpretation:  Sinus rhythm Prolonged PR interval No significant  change since last tracing Confirmed by Rhunette CroftNANAVATI, MD, Janey GentaANKIT [phone]([id number]) on  09/22/2015 4:11:48 AM    Patient was evaluated for acute onset of nausea vomiting and lightheadedness essentially normal except for a slightly bump in his creatinine of 1.29 he was given IV fluids and Zofran after which she still required antiemetic I gave Ativan due to his extreme anxiety over his current illness. After which she was ablated in the hallway without any return of symptoms he's been cautioned to return at any time he develops new or worsening symptoms rash or other concerns.  MDM   Final diagnoses:  Non-intractable  vomiting with nausea, vomiting of unspecified type  Episodic lightheadedness         Earley FavorGail Orella Cushman, NP 09/22/15 0448  Derwood KaplanAnkit Nanavati, MD 09/22/15 [phone]

## 2015-09-22 NOTE — ED Notes (Signed)
Pt c/o waves of nausea and lightheadedness.

## 2015-09-22 NOTE — ED Notes (Signed)
Per EMS, pt sitting at desk and had sudden onset weakness, dizzyness, nausea, diaphoresis, chills. Denies any recent illness, prior hx. Was riding bikes on duty at 4pm yesterday. 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 4mg  zofran IV with 100ml fluids given en route.

## 2015-09-29 ENCOUNTER — Encounter: Payer: Self-pay | Admitting: Family Medicine

## 2015-09-29 ENCOUNTER — Ambulatory Visit (INDEPENDENT_AMBULATORY_CARE_PROVIDER_SITE_OTHER): Payer: 59 | Admitting: Family Medicine

## 2015-09-29 VITALS — BP 138/92 | HR 73 | Temp 98.1°F | Ht 73.5 in | Wt 263.2 lb

## 2015-09-29 DIAGNOSIS — R748 Abnormal levels of other serum enzymes: Secondary | ICD-10-CM

## 2015-09-29 DIAGNOSIS — K648 Other hemorrhoids: Secondary | ICD-10-CM | POA: Diagnosis not present

## 2015-09-29 DIAGNOSIS — R42 Dizziness and giddiness: Secondary | ICD-10-CM | POA: Diagnosis not present

## 2015-09-29 DIAGNOSIS — R7989 Other specified abnormal findings of blood chemistry: Secondary | ICD-10-CM

## 2015-09-29 DIAGNOSIS — Z0000 Encounter for general adult medical examination without abnormal findings: Secondary | ICD-10-CM | POA: Diagnosis not present

## 2015-09-29 DIAGNOSIS — K644 Residual hemorrhoidal skin tags: Secondary | ICD-10-CM

## 2015-09-29 LAB — POCT URINALYSIS DIPSTICK
BILIRUBIN UA: NEGATIVE
Blood, UA: NEGATIVE
GLUCOSE UA: NEGATIVE
KETONES UA: NEGATIVE
LEUKOCYTES UA: NEGATIVE
NITRITE UA: NEGATIVE
PH UA: 5.5
Protein, UA: NEGATIVE
Spec Grav, UA: >=1.03
Urobilinogen, UA: 0.2

## 2015-09-29 MED ORDER — HYDROCORTISONE ACE-PRAMOXINE 1-1 % RE FOAM: 1.0000 | Freq: Two times a day (BID) | RECTAL | Status: DC

## 2015-09-29 NOTE — Patient Instructions (Signed)

## 2015-09-29 NOTE — Progress Notes (Signed)
Pre visit review using our clinic review tool, if applicable. No additional management support is needed unless otherwise documented below in the visit note. 

## 2015-09-29 NOTE — Progress Notes (Signed)
Patient ID: Levi FentonAlfred Leu III, male    DOB: 1980/10/26  Age: 35 y.o. MRN: [id number]    Subjective:  Subjective HPI Levi FentonAlfred Karnik III presents for f/u ED for dizziness and N/V.  He was d/c from ER   Review of Systems  Constitutional: Negative for diaphoresis, appetite change, fatigue and unexpected weight change.  Eyes: Negative for pain, redness and visual disturbance.  Respiratory: Negative for cough, chest tightness, shortness of breath and wheezing.   Cardiovascular: Negative for chest pain, palpitations and leg swelling.  Endocrine: Negative for cold intolerance, heat intolerance, polydipsia, polyphagia and polyuria.  Genitourinary: Negative for dysuria, frequency and difficulty urinating.  Neurological: Negative for dizziness, light-headedness, numbness and headaches.    History Past Medical History  Diagnosis Date  . Palpitations     He has past surgical history that includes None.   His family history includes Alcohol abuse in his maternal grandfather; Arthritis in his paternal grandfather; Colon cancer in his paternal grandfather; Diabetes in his father and paternal grandfather; Heart disease in his maternal grandfather; Hypertension in his father; Lung cancer in his father.He reports that he has been passively smoking Pipe.  He does not have any smokeless tobacco history on file. He reports that he drinks about 2.5 oz of alcohol per week. He reports that he does not use illicit drugs.  No current outpatient prescriptions on file prior to visit.   No current facility-administered medications on file prior to visit.     Objective:  Objective Physical Exam  Constitutional: He is oriented to person, place, and time. Vital signs are normal. He appears well-developed and well-nourished. He is sleeping.  HENT:  Head: Normocephalic and atraumatic.  Mouth/Throat: Oropharynx is clear and moist.  Eyes: EOM are normal. Pupils are equal, round, and reactive to light.  Neck: Normal  range of motion. Neck supple. No thyromegaly present.  Cardiovascular: Normal rate and regular rhythm.   No murmur heard. Pulmonary/Chest: Effort normal and breath sounds normal. No respiratory distress. He has no wheezes. He has no rales. He exhibits no tenderness.  Musculoskeletal: He exhibits no edema or tenderness.  Neurological: He is alert and oriented to person, place, and time.  Skin: Skin is warm and dry.  Psychiatric: He has a normal mood and affect. His behavior is normal. Judgment and thought content normal.  Nursing note and vitals reviewed.  BP 138/92 mmHg  Pulse 73  Temp(Src) 98.1 F (36.7 C) (Oral)  Ht 6' 1.5" (1.867 m)  Wt 263 lb 3.2 oz (119.387 kg)  BMI 34.25 kg/m2  SpO2 98% Wt Readings from Last 3 Encounters:  09/29/15 263 lb 3.2 oz (119.387 kg)  06/02/14 274 lb 3.2 oz (124.376 kg)  04/22/13 270 lb 9.6 oz (122.743 kg)     Lab Results  Component Value Date   WBC 10.5 09/29/2015   HGB 15.4 09/29/2015   HCT 46.2 09/29/2015   PLT 232.0 09/29/2015   GLUCOSE 91 09/29/2015   CHOL 140 09/29/2015   TRIG 73.0 09/29/2015   HDL 42.90 09/29/2015   LDLCALC 83 09/29/2015   ALT 15 09/29/2015   AST 16 09/29/2015   NA 139 09/29/2015   K 4.0 09/29/2015   CL 104 09/29/2015   CREATININE 1.11 09/29/2015   BUN 14 09/29/2015   CO2 30 09/29/2015   TSH 0.82 09/29/2015    No results found.   Assessment & Plan:  Plan I have discontinued Mr. Stetzer's celecoxib, diclofenac, cyclobenzaprine, ibuprofen, and ibuprofen. I am also having him  start on hydrocortisone-pramoxine.  Meds ordered this encounter  Medications  . DISCONTD: ibuprofen (ADVIL,MOTRIN) 800 MG tablet    Sig:   . hydrocortisone-pramoxine (PROCTOFOAM HC) rectal foam    Sig: Place 1 applicator rectally 2 (two) times daily.    Dispense:  10 g    Refill:  0    Problem List Items Addressed This Visit    None    Visit Diagnoses    Elevated serum creatinine    -  Primary    Relevant Orders     Comprehensive metabolic panel (Completed)    CBC with Differential/Platelet (Completed)    Lipid panel (Completed)    POCT urinalysis dipstick (Completed)    TSH (Completed)    Preventative health care        Relevant Orders    CBC with Differential/Platelet (Completed)    Lipid panel (Completed)    POCT urinalysis dipstick (Completed)    TSH (Completed)    Dizziness and giddiness        Relevant Orders    CBC with Differential/Platelet (Completed)    Lipid panel (Completed)    POCT urinalysis dipstick (Completed)    TSH (Completed)    Vitamin B12 (Completed)    External hemorrhoid        Relevant Medications    hydrocortisone-pramoxine (PROCTOFOAM HC) rectal foam       Follow-up: Return if symptoms worsen or fail to improve.  [name], DO

## 2015-09-30 LAB — CBC WITH DIFFERENTIAL/PLATELET
BASOS PCT: 0.1 % (ref 0.0–3.0)
Basophils Absolute: 0 10*3/uL (ref 0.0–0.1)
EOS ABS: 0.1 10*3/uL (ref 0.0–0.7)
Eosinophils Relative: 0.8 % (ref 0.0–5.0)
HEMATOCRIT: 46.2 % (ref 39.0–52.0)
Hemoglobin: 15.4 g/dL (ref 13.0–17.0)
LYMPHS ABS: 1.3 10*3/uL (ref 0.7–4.0)
Lymphocytes Relative: 12.6 % (ref 12.0–46.0)
MCHC: 33.4 g/dL (ref 30.0–36.0)
MCV: 92.4 fl (ref 78.0–100.0)
Monocytes Absolute: 0.9 10*3/uL (ref 0.1–1.0)
Monocytes Relative: 8.3 % (ref 3.0–12.0)
NEUTROS ABS: 8.2 10*3/uL — AB (ref 1.4–7.7)
NEUTROS PCT: 78.2 % — AB (ref 43.0–77.0)
PLATELETS: 232 10*3/uL (ref 150.0–400.0)
RBC: 5 Mil/uL (ref 4.22–5.81)
RDW: 13 % (ref 11.5–15.5)
WBC: 10.5 10*3/uL (ref 4.0–10.5)

## 2015-09-30 LAB — COMPREHENSIVE METABOLIC PANEL
ALT: 15 U/L (ref 0–53)
AST: 16 U/L (ref 0–37)
Albumin: 4.7 g/dL (ref 3.5–5.2)
Alkaline Phosphatase: 66 U/L (ref 39–117)
BILIRUBIN TOTAL: 0.7 mg/dL (ref 0.2–1.2)
BUN: 14 mg/dL (ref 6–23)
CHLORIDE: 104 meq/L (ref 96–112)
CO2: 30 meq/L (ref 19–32)
CREATININE: 1.11 mg/dL (ref 0.40–1.50)
Calcium: 9.7 mg/dL (ref 8.4–10.5)
GFR: 79.2 mL/min (ref >60.00)
GLUCOSE: 91 mg/dL (ref 70–99)
Potassium: 4 mEq/L (ref 3.5–5.1)
SODIUM: 139 meq/L (ref 135–145)
Total Protein: 7.6 g/dL (ref 6.0–8.3)

## 2015-09-30 LAB — VITAMIN B12: VITAMIN B 12: 274 pg/mL (ref 211–911)

## 2015-09-30 LAB — TSH: TSH: 0.82 u[IU]/mL (ref 0.35–4.50)

## 2015-09-30 LAB — LIPID PANEL
CHOL/HDL RATIO: 3
CHOLESTEROL: 140 mg/dL (ref 0–200)
HDL: 42.9 mg/dL (ref >39.00)
LDL Cholesterol: 83 mg/dL (ref 0–99)
NonHDL: 97.22
TRIGLYCERIDES: 73 mg/dL (ref 0.0–149.0)
VLDL: 14.6 mg/dL (ref 0.0–40.0)

## 2015-10-02 DIAGNOSIS — R42 Dizziness and giddiness: Secondary | ICD-10-CM | POA: Insufficient documentation

## 2015-10-02 DIAGNOSIS — Z1211 Encounter for screening for malignant neoplasm of colon: Secondary | ICD-10-CM | POA: Insufficient documentation

## 2015-10-02 DIAGNOSIS — Z0000 Encounter for general adult medical examination without abnormal findings: Secondary | ICD-10-CM | POA: Insufficient documentation

## 2015-10-02 NOTE — Assessment & Plan Note (Signed)
Pt to schedule cpe Labs to be done

## 2015-10-02 NOTE — Assessment & Plan Note (Signed)
Symptoms have improved Pt was very anxious in ER and ativan was given Check labs

## 2015-11-07 ENCOUNTER — Other Ambulatory Visit: Payer: Self-pay | Admitting: Family Medicine

## 2015-11-07 ENCOUNTER — Ambulatory Visit (INDEPENDENT_AMBULATORY_CARE_PROVIDER_SITE_OTHER): Payer: 59 | Admitting: Family Medicine

## 2015-11-07 ENCOUNTER — Encounter: Payer: Self-pay | Admitting: Family Medicine

## 2015-11-07 VITALS — BP 136/96 | HR 95 | Temp 97.8°F | Ht 74.0 in | Wt 260.4 lb

## 2015-11-07 DIAGNOSIS — E538 Deficiency of other specified B group vitamins: Secondary | ICD-10-CM | POA: Diagnosis not present

## 2015-11-07 DIAGNOSIS — F419 Anxiety disorder, unspecified: Secondary | ICD-10-CM | POA: Diagnosis not present

## 2015-11-07 DIAGNOSIS — R5382 Chronic fatigue, unspecified: Secondary | ICD-10-CM

## 2015-11-07 LAB — MONONUCLEOSIS SCREEN: Mono Screen: NEGATIVE

## 2015-11-07 MED ORDER — PAROXETINE HCL ER 12.5 MG PO TB24: 12.5000 mg | ORAL_TABLET | Freq: Every day | ORAL | Status: DC

## 2015-11-07 MED ORDER — CYANOCOBALAMIN 1000 MCG/ML IJ SOLN
1000.0000 ug | Freq: Once | INTRAMUSCULAR | Status: AC
  Administered 2015-11-07: 1000 ug via INTRAMUSCULAR

## 2015-11-07 NOTE — Progress Notes (Signed)
Patient ID: Levi FentonAlfred Fulbright Berger, male    DOB: 10/20/1980  Age: 35 y.o. MRN: [id number]    Subjective:  Subjective HPI Levi FentonAlfred Sefcik Berger presents for c/o anxiety and just "feeling bad".  He feels light headed -- when he does something to keep busy he feels better and then when he is done he is down again. He has these episodes where he feels fuzzy/ disconnected.    Review of Systems  Constitutional: Positive for fatigue. Negative for diaphoresis, appetite change and unexpected weight change.  Eyes: Negative for pain, redness and visual disturbance.  Respiratory: Negative for cough, chest tightness, shortness of breath and wheezing.   Cardiovascular: Negative for chest pain, palpitations and leg swelling.  Endocrine: Negative for cold intolerance, heat intolerance, polydipsia, polyphagia and polyuria.  Genitourinary: Negative for dysuria, frequency and difficulty urinating.  Neurological: Positive for dizziness and light-headedness. Negative for numbness and headaches.  Psychiatric/Behavioral: Positive for sleep disturbance. Negative for suicidal ideas, hallucinations, behavioral problems, confusion, self-injury, dysphoric mood, decreased concentration and agitation. The patient is nervous/anxious. The patient is not hyperactive.     History Past Medical History  Diagnosis Date  . Palpitations     He has past surgical history that includes None.   His family history includes Alcohol abuse in his maternal grandfather; Arthritis in his paternal grandfather; Colon cancer in his paternal grandfather; Diabetes in his father and paternal grandfather; Heart disease in his maternal grandfather; Hypertension in his father; Lung cancer in his father.He reports that he has been passively smoking Pipe.  He does not have any smokeless tobacco history on file. He reports that he drinks about 2.5 oz of alcohol per week. He reports that he does not use illicit drugs.  No current outpatient prescriptions on  file prior to visit.   No current facility-administered medications on file prior to visit.     Objective:  Objective Physical Exam  Constitutional: He is oriented to person, place, and time. Vital signs are normal. He appears well-developed and well-nourished. He is sleeping.  HENT:  Head: Normocephalic and atraumatic.  Mouth/Throat: Oropharynx is clear and moist.  Eyes: EOM are normal. Pupils are equal, round, and reactive to light.  Neck: Normal range of motion. Neck supple. No thyromegaly present.  Cardiovascular: Normal rate and regular rhythm.   No murmur heard. Pulmonary/Chest: Effort normal and breath sounds normal. No respiratory distress. He has no wheezes. He has no rales. He exhibits no tenderness.  Musculoskeletal: He exhibits no edema or tenderness.  Neurological: He is alert and oriented to person, place, and time.  Skin: Skin is warm and dry.  Psychiatric: He has a normal mood and affect. His behavior is normal. Judgment and thought content normal.  Nursing note and vitals reviewed.  BP 136/96 mmHg  Pulse 95  Temp(Src) 97.8 F (36.6 C) (Oral)  Ht 6\' 2"  (1.88 m)  Wt 260 lb 6.4 oz (118.117 kg)  BMI 33.42 kg/m2  SpO2 97% Wt Readings from Last 3 Encounters:  11/07/15 260 lb 6.4 oz (118.117 kg)  09/29/15 263 lb 3.2 oz (119.387 kg)  06/02/14 274 lb 3.2 oz (124.376 kg)     Lab Results  Component Value Date   WBC 10.5 09/29/2015   HGB 15.4 09/29/2015   HCT 46.2 09/29/2015   PLT 232.0 09/29/2015   GLUCOSE 91 09/29/2015   CHOL 140 09/29/2015   TRIG 73.0 09/29/2015   HDL 42.90 09/29/2015   LDLCALC 83 09/29/2015   ALT 15 09/29/2015   AST  16 09/29/2015   NA 139 09/29/2015   K 4.0 09/29/2015   CL 104 09/29/2015   CREATININE 1.11 09/29/2015   BUN 14 09/29/2015   CO2 30 09/29/2015   TSH 0.82 09/29/2015    No results found.   Assessment & Plan:  Plan I have discontinued Mr. Peyser's hydrocortisone-pramoxine. I am also having him start on PARoxetine.  Additionally, I am having him maintain his ibuprofen. We administered cyanocobalamin.  Meds ordered this encounter  Medications  . ibuprofen (ADVIL,MOTRIN) 800 MG tablet    Sig: Take 800 mg by mouth every 8 (eight) hours as needed.  Marland Kitchen PARoxetine (PAXIL CR) 12.5 MG 24 hr tablet    Sig: Take 1 tablet (12.5 mg total) by mouth daily.    Dispense:  30 tablet    Refill:  2  . cyanocobalamin ((VITAMIN B-12)) injection 1,000 mcg    Sig:     Problem List Items Addressed This Visit      Unprioritized   B12 deficiency    b12 injection given today He will receive them weekly x 3 more and then he can con't b12 sublingual or b complex       Other Visit Diagnoses    Chronic fatigue    -  Primary    Relevant Medications    cyanocobalamin ((VITAMIN B-12)) injection 1,000 mcg (Completed)    Other Relevant Orders    Monospot (Completed)    Epstein-Barr virus VCA antibody panel    B. burgdorfi antibodies    Rocky mtn spotted fvr ab, IgM-blood    Anxiety disorder, unspecified anxiety disorder type        Relevant Medications    PARoxetine (PAXIL CR) 12.5 MG 24 hr tablet      many of his symptoms seem anxiety related--- pt is very concerned about possible tick disease and mono---- so we will check for these rx given for paxil but he will wait to take until labs are back  Follow-up: Return in about 4 weeks (around 12/05/2015), or if symptoms worsen or fail to improve, for anxiety.  [name], DO

## 2015-11-07 NOTE — Patient Instructions (Signed)
Generalized Anxiety Disorder Generalized anxiety disorder (GAD) is a mental disorder. It interferes with life functions, including relationships, work, and school. GAD is different from normal anxiety, which everyone experiences at some point in their lives in response to specific life events and activities. Normal anxiety actually helps us prepare for and get through these life events and activities. Normal anxiety goes away after the event or activity is over.  GAD causes anxiety that is not necessarily related to specific events or activities. It also causes excess anxiety in proportion to specific events or activities. The anxiety associated with GAD is also difficult to control. GAD can vary from mild to severe. People with severe GAD can have intense waves of anxiety with physical symptoms (panic attacks).  SYMPTOMS The anxiety and worry associated with GAD are difficult to control. This anxiety and worry are related to many life events and activities and also occur more days than not for 6 months or longer. People with GAD also have three or more of the following symptoms (one or more in children):  Restlessness.   Fatigue.  Difficulty concentrating.   Irritability.  Muscle tension.  Difficulty sleeping or unsatisfying sleep. DIAGNOSIS GAD is diagnosed through an assessment by your health care provider. Your health care provider will ask you questions aboutyour mood,physical symptoms, and events in your life. Your health care provider may ask you about your medical history and use of alcohol or drugs, including prescription medicines. Your health care provider may also do a physical exam and blood tests. Certain medical conditions and the use of certain substances can cause symptoms similar to those associated with GAD. Your health care provider may refer you to a mental health specialist for further evaluation. TREATMENT The following therapies are usually used to treat GAD:    Medication. Antidepressant medication usually is prescribed for long-term daily control. Antianxiety medicines may be added in severe cases, especially when panic attacks occur.   Talk therapy (psychotherapy). Certain types of talk therapy can be helpful in treating GAD by providing support, education, and guidance. A form of talk therapy called cognitive behavioral therapy can teach you healthy ways to think about and react to daily life events and activities.  Stress managementtechniques. These include yoga, meditation, and exercise and can be very helpful when they are practiced regularly. A mental health specialist can help determine which treatment is best for you. Some people see improvement with one therapy. However, other people require a combination of therapies.   This information is not intended to replace advice given to you by your health care provider. Make sure you discuss any questions you have with your health care provider.   Document Released: 10/13/2012 Document Revised: 07/09/2014 Document Reviewed: 10/13/2012 Elsevier Interactive Patient Education 2016 Elsevier Inc.  

## 2015-11-07 NOTE — Assessment & Plan Note (Signed)
b12 injection given today He will receive them weekly x 3 more and then he can con't b12 sublingual or b complex

## 2015-11-07 NOTE — Progress Notes (Signed)
Pre visit review using our clinic review tool, if applicable. No additional management support is needed unless otherwise documented below in the visit note. 

## 2015-11-08 ENCOUNTER — Encounter: Payer: Self-pay | Admitting: Family Medicine

## 2015-11-08 LAB — LYME AB/WESTERN BLOT REFLEX

## 2015-11-08 LAB — EPSTEIN-BARR VIRUS VCA ANTIBODY PANEL
EBV NA IgG: 38.9 U/mL — ABNORMAL HIGH (ref <18.0)
EBV VCA IgG: >750 U/mL — ABNORMAL HIGH (ref <18.0)

## 2015-11-11 LAB — ROCKY MTN SPOTTED FVR ABS PNL(IGG+IGM)
RMSF IGG: NOT DETECTED
RMSF IGM: NOT DETECTED

## 2015-11-15 ENCOUNTER — Ambulatory Visit (INDEPENDENT_AMBULATORY_CARE_PROVIDER_SITE_OTHER): Payer: 59 | Admitting: *Deleted

## 2015-11-15 ENCOUNTER — Ambulatory Visit: Payer: 59

## 2015-11-15 DIAGNOSIS — E538 Deficiency of other specified B group vitamins: Secondary | ICD-10-CM | POA: Diagnosis not present

## 2015-11-15 MED ORDER — CYANOCOBALAMIN 1000 MCG/ML IJ SOLN
1000.0000 ug | Freq: Once | INTRAMUSCULAR | Status: AC
  Administered 2015-11-15: 1000 ug via INTRAMUSCULAR

## 2015-11-15 NOTE — Progress Notes (Signed)
Pre visit review using our clinic review tool, if applicable. No additional management support is needed unless otherwise documented below in the visit note.  Pt tolerated injection well.   Next appt: 11/22/15  Omnia Dollinger J Gareld Obrecht, RN  

## 2015-11-22 ENCOUNTER — Ambulatory Visit (INDEPENDENT_AMBULATORY_CARE_PROVIDER_SITE_OTHER): Payer: 59

## 2015-11-22 DIAGNOSIS — E538 Deficiency of other specified B group vitamins: Secondary | ICD-10-CM

## 2015-11-22 MED ORDER — CYANOCOBALAMIN 1000 MCG/ML IJ SOLN
1000.0000 ug | Freq: Once | INTRAMUSCULAR | Status: AC
  Administered 2015-11-22: 1000 ug via INTRAMUSCULAR

## 2015-11-22 NOTE — Progress Notes (Signed)
Pre visit review using our clinic tool,if applicable. No additional management support is needed unless otherwise documented below in the visit note.   Per order from Dr. Seabron SpatesYvonne Lowne-Chase patient in for B12 injection. Given IM L deltoid.   Patient tolerated well. Patient has future appointments scheduled for next injection.

## 2015-11-29 ENCOUNTER — Ambulatory Visit (INDEPENDENT_AMBULATORY_CARE_PROVIDER_SITE_OTHER): Payer: 59 | Admitting: *Deleted

## 2015-11-29 DIAGNOSIS — E538 Deficiency of other specified B group vitamins: Secondary | ICD-10-CM | POA: Diagnosis not present

## 2015-11-29 MED ORDER — CYANOCOBALAMIN 1000 MCG/ML IJ SOLN
1000.0000 ug | Freq: Once | INTRAMUSCULAR | Status: AC
  Administered 2015-11-29: 1000 ug via INTRAMUSCULAR

## 2015-11-29 NOTE — Progress Notes (Signed)
Pre visit review using our clinic review tool, if applicable. No additional management support is needed unless otherwise documented below in the visit note.  Pt tolerated injection well.   Next appt: 12/08/15 w/ Dr. Laury AxonLowne, due for B12 level at appt  Starla Linkarolyn J Malahki Gasaway, RN

## 2015-12-08 ENCOUNTER — Encounter: Payer: Self-pay | Admitting: Family Medicine

## 2015-12-08 ENCOUNTER — Ambulatory Visit (INDEPENDENT_AMBULATORY_CARE_PROVIDER_SITE_OTHER): Payer: 59 | Admitting: Family Medicine

## 2015-12-08 VITALS — BP 130/82 | HR 74 | Temp 98.2°F | Ht 74.0 in | Wt 265.0 lb

## 2015-12-08 DIAGNOSIS — R5382 Chronic fatigue, unspecified: Secondary | ICD-10-CM

## 2015-12-08 DIAGNOSIS — E538 Deficiency of other specified B group vitamins: Secondary | ICD-10-CM

## 2015-12-08 DIAGNOSIS — H539 Unspecified visual disturbance: Secondary | ICD-10-CM

## 2015-12-08 NOTE — Progress Notes (Signed)
Pre visit review using our clinic review tool, if applicable. No additional management support is needed unless otherwise documented below in the visit note. 

## 2015-12-08 NOTE — Progress Notes (Signed)
Patient ID: Levi Berger, male    DOB: [birth year]  Age: 35 y.o. MRN: [id number]    Subjective:  Subjective HPI Levi Berger presents for f/u fatigue.  He feels a little better but overall he is still gets tired easily.  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  Pt is asking for "scan of the brain" ---- I explained we could not order a "scan" without a reason.   His labs so far are normal except for EBV.   b12 is not helping either.  He complains of restless legs at times and abnormal vision.  He describes seeing spots when his eyes are closed.  He just had his eyes checked and they were normal.   Review of Systems  Constitutional: Positive for fatigue. Negative for diaphoresis, appetite change and unexpected weight change.  Eyes: Negative for pain, redness and visual disturbance.  Respiratory: Negative for cough, chest tightness, shortness of breath and wheezing.   Cardiovascular: Negative for chest pain, palpitations and leg swelling.  Endocrine: Negative for cold intolerance, heat intolerance, polydipsia, polyphagia and polyuria.  Genitourinary: Negative for dysuria, frequency and difficulty urinating.  Neurological: Negative for dizziness, light-headedness, numbness and headaches.    History Past Medical History  Diagnosis Date  . Palpitations     He has past surgical history that includes None.   His family history includes Alcohol abuse in his maternal grandfather; Arthritis in his paternal grandfather; Colon cancer in his paternal grandfather; Diabetes in his father and paternal grandfather; Heart disease in his maternal grandfather; Hypertension in his father; Lung cancer in his father.He reports that he has been passively smoking Pipe.  He does not have any smokeless tobacco history on file. He reports that he  drinks about 2.5 oz of alcohol per week. He reports that he does not use illicit drugs.  Current Outpatient Prescriptions on File Prior to Visit  Medication Sig Dispense Refill  . ibuprofen (ADVIL,MOTRIN) 800 MG tablet Take 800 mg by mouth every 8 (eight) hours as needed.    Marland Kitchen PARoxetine (PAXIL CR) 12.5 MG 24 hr tablet Take 1 tablet (12.5 mg total) by mouth daily. 30 tablet 2   No current facility-administered medications on file prior to visit.     Objective:  Objective Physical Exam  Constitutional: He is oriented to person, place, and time. Vital signs are normal. He appears well-developed and well-nourished. He is sleeping.  HENT:  Head: Normocephalic and atraumatic.  Mouth/Throat: Oropharynx is clear and moist.  Eyes: EOM are normal. Pupils are equal, round, and reactive to light.  Neck: Normal range of motion. Neck supple. No thyromegaly present.  Cardiovascular: Normal rate and regular rhythm.   No murmur heard. Pulmonary/Chest: Effort normal and breath sounds normal. No respiratory distress. He has no wheezes. He has no rales. He exhibits no tenderness.  Musculoskeletal: He exhibits no edema or tenderness.  Neurological: He is alert and oriented to person, place, and time.  Skin: Skin is warm and dry.  Psychiatric: He has a normal mood and affect. His behavior is normal. Judgment and thought content normal.  Nursing note and vitals reviewed.  BP 130/82 mmHg  Pulse 74  Temp(Src) 98.2 F (36.8 C) (Oral)  Ht _0  (1.88 m)  Wt 265 lb (120.203 kg)  BMI 34.01 kg/m2  SpO2 97% Wt Readings from Last 3 Encounters:  12/08/15 265 lb (120.203 kg)  11/07/15 260 lb 6.4 oz (118.117 kg)  09/29/15 263 lb 3.2 oz (119.387 kg)     Lab Results  Component Value Date   WBC 10.5 09/29/2015   HGB 15.4 09/29/2015   HCT 46.2 09/29/2015   PLT 232.0 09/29/2015   GLUCOSE 91 09/29/2015   CHOL 140 09/29/2015   TRIG 73.0 09/29/2015   HDL 42.90 09/29/2015   LDLCALC 83 09/29/2015   ALT 15  09/29/2015   AST 16 09/29/2015   NA 139 09/29/2015   K 4.0 09/29/2015   CL 104 09/29/2015   CREATININE 1.11 09/29/2015   BUN 14 09/29/2015   CO2 30 09/29/2015   TSH 0.82 09/29/2015    No results found.   Assessment & Plan:  Plan I am having Mr. Epple maintain his ibuprofen and PARoxetine.  No orders of the defined types were placed in this encounter.    Problem List Items Addressed This Visit    B12 deficiency   Relevant Orders   Sed Rate (ESR)   High sensitivity CRP   CBC with Differential/Platelet   Vitamin B12    Other Visit Diagnoses    Chronic fatigue    -  Primary    Relevant Orders    Sed Rate (ESR)    High sensitivity CRP    CBC with Differential/Platelet    Vitamin B12    Abnormal vision        Relevant Orders    MR Brain Wo Contrast     discussed with hematology.  Ebv and b12 felt to be normal.   b12 5000 mcg daily SL Discussed causes of fatigue---- stress, diet, lack of exercse etc--- he is getting plenty of exercise with his job Follow-up: Return in about 3 months (around 03/09/2016), or if symptoms worsen or fail to improve.  [name], DO

## 2015-12-08 NOTE — Patient Instructions (Addendum)
Chronic Fatigue Syndrome Chronic fatigue syndrome (CFS) is a condition in which there is lasting, extreme tiredness (fatigue) that does not improve with rest. CFS affects women up to four times more often than men. If you have CFS, fatigue and other symptoms can make it hard for you to get through your day. There is no treatment or cure. You will need to work closely with your health care provider to come up with a treatment plan that works for you. CAUSES  No one knows what causes CFS. It may be triggered by a flu-like illness or by mono. Other triggers may include:  An abnormal immune system.  Low blood pressure.  Poor diet.  Physical or emotional stress. SIGNS AND SYMPTOMS The main symptom is fatigue that lasts all day, especially after physical or mental stress. Other common symptoms include:  An extreme loss of energy with no obvious cause.  Muscle or joint soreness.  Severe weakness.  Frequent headaches.  Fever.  Sore throat.  Swollen lymph glands.  Sleep is not refreshing.  Loss of concentration or memory. Less common symptoms may include:  Chills.  Night sweats.  Tingling or numbness.  Blurred vision.  Dizziness.  Sensitivity to noise or odors.  Mood swings.  Anxiety, panic attacks, and depression. Your symptoms may come and go, or you may have them all the time. DIAGNOSIS  There are no tests that can help health care providers diagnose CFS. It may take a long time for you to get a correct diagnosis. Your health care provider may need to do a number of tests to rule out other conditions that could be causing your symptoms. You may be diagnosed with CFS if:  You have fatigue that has lasted for at least six months.  Your fatigue is not relieved by rest.  Your fatigue is not caused by another condition.  Your fatigue is severe enough to interfere with work and daily activities.  You have at least four common symptoms of CFS. TREATMENT  There is no  cure for CFS at this time. The condition affects everyone differently. You will need to work with your health care provider to find the best treatment for your symptoms. Treatment may include:  Improving sleep with a regular bedtime routine.  Avoiding caffeine, alcohol, and tobacco.  Doing light exercise and stretching during the day.  Taking medicine to help you sleep.  Taking over-the-counter medicines to relieve joint or muscle pain.  Learning and practicing relaxation techniques.  Using memory aids or doing brain teasers to improve memory and concentration.  Seeing a mental health professional to evaluate and treat depression, if necessary.  Trying massage therapy, acupuncture, and movement exercises, like yoga or tai chi. HOME CARE INSTRUCTIONS Work closely with your health care provider to follow your treatment plan at home. You may need to make major lifestyle changes. If treatment does not seem to help, get a second opinion. You may get help from many health care providers, including doctors, mental health specialists, physical therapists, and rehabilitation therapists. Having the support of friends and loved ones is also important. SEEK MEDICAL CARE IF:  Your symptoms are not responding to treatment.  You are having strong feelings of anger, guilt, anxiety, or depression.   This information is not intended to replace advice given to you by your health care provider. Make sure you discuss any questions you have with your health care provider.   Document Released: 07/26/2004 Document Revised: 07/09/2014 Document Reviewed: 05/08/2013 Elsevier Interactive Patient   Education 2016 Elsevier Inc.  

## 2015-12-09 ENCOUNTER — Telehealth: Payer: Self-pay

## 2015-12-09 LAB — CBC WITH DIFFERENTIAL/PLATELET
BASOS ABS: 0 10*3/uL (ref 0.0–0.1)
Basophils Relative: 0.2 % (ref 0.0–3.0)
EOS ABS: 0.2 10*3/uL (ref 0.0–0.7)
Eosinophils Relative: 2.9 % (ref 0.0–5.0)
HCT: 46.3 % (ref 39.0–52.0)
Hemoglobin: 15.4 g/dL (ref 13.0–17.0)
LYMPHS ABS: 1.4 10*3/uL (ref 0.7–4.0)
Lymphocytes Relative: 19 % (ref 12.0–46.0)
MCHC: 33.3 g/dL (ref 30.0–36.0)
MCV: 91.3 fl (ref 78.0–100.0)
MONO ABS: 0.6 10*3/uL (ref 0.1–1.0)
Monocytes Relative: 8.1 % (ref 3.0–12.0)
Neutro Abs: 5.2 10*3/uL (ref 1.4–7.7)
Neutrophils Relative %: 69.8 % (ref 43.0–77.0)
PLATELETS: 204 10*3/uL (ref 150.0–400.0)
RBC: 5.07 Mil/uL (ref 4.22–5.81)
RDW: 13.4 % (ref 11.5–15.5)
WBC: 7.4 10*3/uL (ref 4.0–10.5)

## 2015-12-09 LAB — VITAMIN B12: Vitamin B-12: 798 pg/mL (ref 211–911)

## 2015-12-09 LAB — HIGH SENSITIVITY CRP: CRP HIGH SENSITIVITY: 4.61 mg/L (ref 0.000–5.000)

## 2015-12-09 LAB — SEDIMENTATION RATE: SED RATE: 1 mm/h (ref 0–15)

## 2015-12-09 NOTE — Telephone Encounter (Signed)
-----   Message from Donato SchultzYvonne R Lowne Chase, DO sent at 12/08/2015  5:46 PM EDT ----- Let pt know Dr Myna HidalgoEnnever (hematologist)  Did not feel there was anything troublesome with his blood work---- he can stop the b12 and take otc sublingual b12 5000 mcg daily.      ----- Message -----    From: Josph MachoPeter R Ennever, MD    Sent: 12/08/2015   5:31 PM      To: Donato SchultzYvonne R Lowne Chase, DO  Yvonne:  I looked at all the labs.  I really do not see anything that is troublesome to me!!  I really do not think that the B12 level is a problem.  However, if he took some oral B12 at 5000mcg a day this might provide a "placebo" effect and could improve his fatigue.  I do not think that the EBV titer signifies any malignancy.  I suppose he may have chronic fatigue syndrome??  I will pray for him and you!!!  Have a great summer!!!  Pete ----- Message -----    From: Donato SchultzYvonne R Lowne Chase, DO    Sent: 12/08/2015   5:06 PM      To: Josph MachoPeter R Ennever, MD  Hey there! Hope you are doing well This pt has been seeing me for a few months now-- c/o of extreme fatigue and some other vague symptoms.  The only labs that came back abnormal were ebv and low normal b12.   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  He is requesting a scan of his head (but there really isnt a good reason)  I don't want to waste your time but was hoping you could look at his labs and let me know if im correct of if I need to do something else because of the abnormal EBV ( we told him it was past mono but of course they also get scared by the note on bottom of lab about lymphoma and nasopharyngeal ca)   Thanks, Myrene BuddyYvonne

## 2015-12-09 NOTE — Telephone Encounter (Signed)
Patient has been made aware and verbalized understanding, the patient wanted to know the status of the MRI and I made him aware it was pending.     KP

## 2015-12-17 ENCOUNTER — Ambulatory Visit (HOSPITAL_BASED_OUTPATIENT_CLINIC_OR_DEPARTMENT_OTHER)
Admission: RE | Admit: 2015-12-17 | Discharge: 2015-12-17 | Disposition: A | Discharge status: Home / Self Care | Payer: 59 | Source: Ambulatory Visit | Attending: Family Medicine | Admitting: Family Medicine

## 2015-12-17 DIAGNOSIS — H539 Unspecified visual disturbance: Secondary | ICD-10-CM | POA: Diagnosis not present

## 2015-12-26 ENCOUNTER — Encounter: Payer: Self-pay | Admitting: Family Medicine

## 2015-12-26 ENCOUNTER — Ambulatory Visit (INDEPENDENT_AMBULATORY_CARE_PROVIDER_SITE_OTHER): Payer: 59 | Admitting: Family Medicine

## 2015-12-26 VITALS — BP 124/80 | HR 82 | Temp 98.4°F | Ht 74.0 in | Wt 265.8 lb

## 2015-12-26 DIAGNOSIS — R5382 Chronic fatigue, unspecified: Secondary | ICD-10-CM | POA: Diagnosis not present

## 2015-12-26 DIAGNOSIS — F419 Anxiety disorder, unspecified: Secondary | ICD-10-CM | POA: Diagnosis not present

## 2015-12-26 DIAGNOSIS — F411 Generalized anxiety disorder: Secondary | ICD-10-CM | POA: Insufficient documentation

## 2015-12-26 HISTORY — DX: Generalized anxiety disorder: F41.1

## 2015-12-26 MED ORDER — PAROXETINE HCL ER 12.5 MG PO TB24: 12.5000 mg | ORAL_TABLET | Freq: Every day | ORAL | Status: DC

## 2015-12-26 NOTE — Patient Instructions (Signed)
Generalized Anxiety Disorder Generalized anxiety disorder (GAD) is a mental disorder. It interferes with life functions, including relationships, work, and school. GAD is different from normal anxiety, which everyone experiences at some point in their lives in response to specific life events and activities. Normal anxiety actually helps us prepare for and get through these life events and activities. Normal anxiety goes away after the event or activity is over.  GAD causes anxiety that is not necessarily related to specific events or activities. It also causes excess anxiety in proportion to specific events or activities. The anxiety associated with GAD is also difficult to control. GAD can vary from mild to severe. People with severe GAD can have intense waves of anxiety with physical symptoms (panic attacks).  SYMPTOMS The anxiety and worry associated with GAD are difficult to control. This anxiety and worry are related to many life events and activities and also occur more days than not for 6 months or longer. People with GAD also have three or more of the following symptoms (one or more in children):  Restlessness.   Fatigue.  Difficulty concentrating.   Irritability.  Muscle tension.  Difficulty sleeping or unsatisfying sleep. DIAGNOSIS GAD is diagnosed through an assessment by your health care provider. Your health care provider will ask you questions aboutyour mood,physical symptoms, and events in your life. Your health care provider may ask you about your medical history and use of alcohol or drugs, including prescription medicines. Your health care provider may also do a physical exam and blood tests. Certain medical conditions and the use of certain substances can cause symptoms similar to those associated with GAD. Your health care provider may refer you to a mental health specialist for further evaluation. TREATMENT The following therapies are usually used to treat GAD:    Medication. Antidepressant medication usually is prescribed for long-term daily control. Antianxiety medicines may be added in severe cases, especially when panic attacks occur.   Talk therapy (psychotherapy). Certain types of talk therapy can be helpful in treating GAD by providing support, education, and guidance. A form of talk therapy called cognitive behavioral therapy can teach you healthy ways to think about and react to daily life events and activities.  Stress managementtechniques. These include yoga, meditation, and exercise and can be very helpful when they are practiced regularly. A mental health specialist can help determine which treatment is best for you. Some people see improvement with one therapy. However, other people require a combination of therapies.   This information is not intended to replace advice given to you by your health care provider. Make sure you discuss any questions you have with your health care provider.   Document Released: 10/13/2012 Document Revised: 07/09/2014 Document Reviewed: 10/13/2012 Elsevier Interactive Patient Education 2016 Elsevier Inc.  

## 2015-12-26 NOTE — Assessment & Plan Note (Signed)
Restart paxil cr 12.5 mg daily F/u 1 month Note given for work for 1 month Pt was not interested in counseling

## 2015-12-26 NOTE — Progress Notes (Signed)
Pre visit review using our clinic review tool, if applicable. No additional management support is needed unless otherwise documented below in the visit note. 

## 2015-12-26 NOTE — Progress Notes (Signed)
Patient ID: Levi Berger, male    DOB: 20-Jun-1981  Age: 35 y.o. MRN: [id number]    Subjective:  Subjective HPI Levi FentonAlfred Karow Berger presents for f/u anxiety and chronic fatigue.  Pt mother came with him.  We recapped his entire illness starting with April --- he now believes there is an anxiety component.  Mom states he is ocd and has always been.  Since moving from his apartment to his new house he has been under a lot of stress and he has had trouble getting through a day at work because he is easily fatigued.  Labs all reviewed again and pt is requesting a testosterone level.     Review of Systems  Constitutional: Negative for diaphoresis, appetite change, fatigue and unexpected weight change.  Eyes: Negative for pain, redness and visual disturbance.  Respiratory: Negative for cough, chest tightness, shortness of breath and wheezing.   Cardiovascular: Negative for chest pain, palpitations and leg swelling.  Endocrine: Negative for cold intolerance, heat intolerance, polydipsia, polyphagia and polyuria.  Genitourinary: Negative for dysuria, frequency and difficulty urinating.  Neurological: Negative for dizziness, light-headedness, numbness and headaches.  Psychiatric/Behavioral: Positive for decreased concentration. Negative for suicidal ideas, hallucinations, sleep disturbance, self-injury and dysphoric mood. The patient is nervous/anxious. The patient is not hyperactive.     History Past Medical History  Diagnosis Date  . Palpitations     He has past surgical history that includes None.   His family history includes Alcohol abuse in his maternal grandfather; Arthritis in his paternal grandfather; Colon cancer in his paternal grandfather; Diabetes in his father and paternal grandfather; Heart disease in his maternal grandfather; Hypertension in his father; Lung cancer in his father.He reports that he has been passively smoking Pipe.  He does not have any smokeless tobacco history on  file. He reports that he drinks about 2.5 oz of alcohol per week. He reports that he does not use illicit drugs.  Current Outpatient Prescriptions on File Prior to Visit  Medication Sig Dispense Refill  . ibuprofen (ADVIL,MOTRIN) 800 MG tablet Take 800 mg by mouth every 8 (eight) hours as needed.     No current facility-administered medications on file prior to visit.     Objective:  Objective Physical Exam  Constitutional: He is oriented to person, place, and time. Vital signs are normal. He appears well-developed and well-nourished. He is sleeping.  HENT:  Head: Normocephalic and atraumatic.  Mouth/Throat: Oropharynx is clear and moist.  Eyes: EOM are normal. Pupils are equal, round, and reactive to light.  Neck: Normal range of motion. Neck supple. No thyromegaly present.  Cardiovascular: Normal rate and regular rhythm.   No murmur heard. Pulmonary/Chest: Effort normal and breath sounds normal. No respiratory distress. He has no wheezes. He has no rales. He exhibits no tenderness.  Musculoskeletal: He exhibits no edema or tenderness.  Neurological: He is alert and oriented to person, place, and time.  Skin: Skin is warm and dry.  Psychiatric: His behavior is normal. Judgment and thought content normal. His mood appears anxious. He does not exhibit a depressed mood.  Nursing note and vitals reviewed.  BP 124/80 mmHg  Pulse 82  Temp(Src) 98.4 F (36.9 C) (Oral)  Ht 6\' 2"  (1.88 m)  Wt 265 lb 12.8 oz (120.566 kg)  BMI 34.11 kg/m2  SpO2 98% Wt Readings from Last 3 Encounters:  12/26/15 265 lb 12.8 oz (120.566 kg)  12/08/15 265 lb (120.203 kg)  11/07/15 260 lb 6.4 oz (118.117 kg)  Lab Results  Component Value Date   WBC 7.4 12/08/2015   HGB 15.4 12/08/2015   HCT 46.3 12/08/2015   PLT 204.0 12/08/2015   GLUCOSE 91 09/29/2015   CHOL 140 09/29/2015   TRIG 73.0 09/29/2015   HDL 42.90 09/29/2015   LDLCALC 83 09/29/2015   ALT 15 09/29/2015   AST 16 09/29/2015   NA 139  09/29/2015   K 4.0 09/29/2015   CL 104 09/29/2015   CREATININE 1.11 09/29/2015   BUN 14 09/29/2015   CO2 30 09/29/2015   TSH 0.82 09/29/2015    Mr Brain Wo Contrast  12/17/2015  CLINICAL DATA:  Unexplained persistent illness, associated with numbness and tingling on the RIGHT side, elevated blood pressure, lightheadedness, brain fog and visual abnormalities since March 2017. EXAM: MRI HEAD WITHOUT CONTRAST TECHNIQUE: Multiplanar, multiecho pulse sequences of the brain and surrounding structures were obtained without intravenous contrast. COMPARISON:  None. FINDINGS: No evidence for acute infarction, hemorrhage, mass lesion, hydrocephalus, or extra-axial fluid. Normal cerebral volume. No white matter disease. Pituitary, pineal, and cerebellar tonsils unremarkable. No upper cervical lesions. Flow voids are maintained throughout the carotid, basilar, and vertebral arteries. There are no areas of chronic hemorrhage. Visualized calvarium, skull base, and upper cervical osseous structures unremarkable. Scalp and extracranial soft tissues, orbits, sinuses, and mastoids show no acute process. Maxillary and sphenoid sinus retention cysts, non worrisome. IMPRESSION: Negative exam.  No cause seen for reported symptoms. Electronically Signed   By: Elsie StainJohn T Curnes M.D.   On: 12/17/2015 14:37     Assessment & Plan:  Plan I am having Mr. Ruest maintain his ibuprofen and PARoxetine.  Meds ordered this encounter  Medications  . DISCONTD: PARoxetine (PAXIL CR) 12.5 MG 24 hr tablet    Sig: Take 1 tablet (12.5 mg total) by mouth daily.    Dispense:  30 tablet    Refill:  2  . PARoxetine (PAXIL CR) 12.5 MG 24 hr tablet    Sig: Take 1 tablet (12.5 mg total) by mouth daily.    Dispense:  30 tablet    Refill:  2    Problem List Items Addressed This Visit      Unprioritized   Chronic fatigue   Relevant Orders   Testos,Total,Free and SHBG (Male)   Generalized anxiety disorder    Restart paxil cr 12.5 mg  daily F/u 1 month Note given for work for 1 month Pt was not interested in counseling       Other Visit Diagnoses    Anxiety disorder, unspecified anxiety disorder type    -  Primary    Relevant Medications    PARoxetine (PAXIL CR) 12.5 MG 24 hr tablet       Follow-up: Return in about 4 weeks (around 01/23/2016), or if symptoms worsen or fail to improve, for fu depression.  Donato SchultzYvonne R Lowne Chase, DO

## 2015-12-28 LAB — TESTOS,TOTAL,FREE AND SHBG (FEMALE)
Sex Hormone Binding Glob.: 32 nmol/L (ref 10–50)
TESTOSTERONE,FREE: 49.9 pg/mL (ref 35.0–155.0)
TESTOSTERONE,TOTAL,LC/MS/MS: 378 ng/dL (ref 250–1100)

## 2016-01-16 ENCOUNTER — Other Ambulatory Visit: Payer: Self-pay

## 2016-01-16 ENCOUNTER — Encounter: Payer: Self-pay | Admitting: Family Medicine

## 2016-01-16 DIAGNOSIS — F419 Anxiety disorder, unspecified: Secondary | ICD-10-CM

## 2016-01-16 MED ORDER — PAROXETINE HCL 20 MG PO TABS: 20.0000 mg | ORAL_TABLET | Freq: Every day | ORAL | Status: DC

## 2016-01-16 NOTE — Telephone Encounter (Signed)
Ok to inc to 20 mg #30  2 refills and keep f/u

## 2016-01-24 ENCOUNTER — Encounter: Payer: Self-pay | Admitting: Family Medicine

## 2016-01-24 MED ORDER — PAROXETINE HCL 20 MG PO TABS: 20.0000 mg | ORAL_TABLET | Freq: Every day | ORAL | 2 refills | Status: DC

## 2016-03-15 ENCOUNTER — Encounter: Payer: Self-pay | Admitting: Family Medicine

## 2016-03-15 ENCOUNTER — Ambulatory Visit (INDEPENDENT_AMBULATORY_CARE_PROVIDER_SITE_OTHER): Payer: 59 | Admitting: Family Medicine

## 2016-03-15 VITALS — BP 138/86 | HR 84 | Temp 98.9°F | Resp 17 | Ht 76.0 in | Wt 271.0 lb

## 2016-03-15 DIAGNOSIS — F411 Generalized anxiety disorder: Secondary | ICD-10-CM | POA: Diagnosis not present

## 2016-03-15 MED ORDER — PAROXETINE HCL 10 MG PO TABS: 10.0000 mg | ORAL_TABLET | Freq: Every day | ORAL | 3 refills | Status: DC

## 2016-03-15 NOTE — Progress Notes (Signed)
Patient ID: Levi FentonAlfred Ocain III, male    DOB: 1981-02-17  Age: 35 y.o. MRN: [id number]    Subjective:  Subjective  HPI Levi FentonAlfred Christine III presents for f/u anxiety.  He is overall doing well but c/o fatigue and visual disturbance since inc dose.  No other complaints.    Review of Systems  Constitutional: Positive for fatigue. Negative for appetite change, diaphoresis and unexpected weight change.  Eyes: Positive for visual disturbance. Negative for pain and redness.  Respiratory: Negative for cough, chest tightness, shortness of breath and wheezing.   Cardiovascular: Negative for chest pain, palpitations and leg swelling.  Endocrine: Negative for cold intolerance, heat intolerance, polydipsia, polyphagia and polyuria.  Genitourinary: Negative for difficulty urinating, dysuria and frequency.  Neurological: Negative for dizziness, light-headedness, numbness and headaches.  Psychiatric/Behavioral: Negative for decreased concentration and sleep disturbance. The patient is nervous/anxious.     History Past Medical History:  Diagnosis Date  . Palpitations     He has a past surgical history that includes None.   His family history includes Alcohol abuse in his maternal grandfather; Arthritis in his paternal grandfather; Colon cancer in his paternal grandfather; Diabetes in his father and paternal grandfather; Heart disease in his maternal grandfather; Hypertension in his father; Lung cancer in his father.He reports that he is a non-smoker but has been exposed to tobacco smoke. He does not have any smokeless tobacco history on file. He reports that he drinks about 2.5 oz of alcohol per week . He reports that he does not use drugs.  Current Outpatient Prescriptions on File Prior to Visit  Medication Sig Dispense Refill  . ibuprofen (ADVIL,MOTRIN) 800 MG tablet Take 800 mg by mouth every 8 (eight) hours as needed.     No current facility-administered medications on file prior to visit.        Objective:  Objective  Physical Exam  Constitutional: He is oriented to person, place, and time. Vital signs are normal. He appears well-developed and well-nourished. He is sleeping.  HENT:  Head: Normocephalic and atraumatic.  Mouth/Throat: Oropharynx is clear and moist.  Eyes: EOM are normal. Pupils are equal, round, and reactive to light.  Neck: Normal range of motion. Neck supple. No thyromegaly present.  Cardiovascular: Normal rate and regular rhythm.   No murmur heard. Pulmonary/Chest: Effort normal and breath sounds normal. No respiratory distress. He has no wheezes. He has no rales. He exhibits no tenderness.  Musculoskeletal: He exhibits no edema or tenderness.  Neurological: He is alert and oriented to person, place, and time.  Skin: Skin is warm and dry.  Psychiatric: He has a normal mood and affect. His behavior is normal. Judgment and thought content normal.  Nursing note and vitals reviewed.  BP 138/86 (BP Location: Right Arm, Patient Position: Sitting, Cuff Size: Large)   Pulse 84   Temp 98.9 F (37.2 C) (Oral)   Resp 17   Ht 6\' 4"  (1.93 m)   Wt 271 lb (122.9 kg)   SpO2 98%   BMI 32.99 kg/m  Wt Readings from Last 3 Encounters:  03/15/16 271 lb (122.9 kg)  12/26/15 265 lb 12.8 oz (120.6 kg)  12/08/15 265 lb (120.2 kg)     Lab Results  Component Value Date   WBC 7.4 12/08/2015   HGB 15.4 12/08/2015   HCT 46.3 12/08/2015   PLT 204.0 12/08/2015   GLUCOSE 91 09/29/2015   CHOL 140 09/29/2015   TRIG 73.0 09/29/2015   HDL 42.90 09/29/2015   LDLCALC 83  09/29/2015   ALT 15 09/29/2015   AST 16 09/29/2015   NA 139 09/29/2015   K 4.0 09/29/2015   CL 104 09/29/2015   CREATININE 1.11 09/29/2015   BUN 14 09/29/2015   CO2 30 09/29/2015   TSH 0.82 09/29/2015    Mr Brain Wo Contrast  Result Date: 12/17/2015 CLINICAL DATA:  Unexplained persistent illness, associated with numbness and tingling on the RIGHT side, elevated blood pressure, lightheadedness, brain  fog and visual abnormalities since March 2017. EXAM: MRI HEAD WITHOUT CONTRAST TECHNIQUE: Multiplanar, multiecho pulse sequences of the brain and surrounding structures were obtained without intravenous contrast. COMPARISON:  None. FINDINGS: No evidence for acute infarction, hemorrhage, mass lesion, hydrocephalus, or extra-axial fluid. Normal cerebral volume. No white matter disease. Pituitary, pineal, and cerebellar tonsils unremarkable. No upper cervical lesions. Flow voids are maintained throughout the carotid, basilar, and vertebral arteries. There are no areas of chronic hemorrhage. Visualized calvarium, skull base, and upper cervical osseous structures unremarkable. Scalp and extracranial soft tissues, orbits, sinuses, and mastoids show no acute process. Maxillary and sphenoid sinus retention cysts, non worrisome. IMPRESSION: Negative exam.  No cause seen for reported symptoms. Electronically Signed   By: Elsie Stain M.D.   On: 12/17/2015 14:37     Assessment & Plan:  Plan  I have discontinued Mr. Ogren's PARoxetine. I am also having him start on PARoxetine. Additionally, I am having him maintain his ibuprofen.  Meds ordered this encounter  Medications  . PARoxetine (PAXIL) 10 MG tablet    Sig: Take 1 tablet (10 mg total) by mouth daily.    Dispense:  90 tablet    Refill:  3    Problem List Items Addressed This Visit      Unprioritized   Generalized anxiety disorder - Primary   Relevant Medications   PARoxetine (PAXIL) 10 MG tablet    Other Visit Diagnoses   None.   lower paxil does again ------ pt c/o feeling funny with 20 mg dose   Follow-up: Return in about 6 months (around 09/12/2016), or anxiety.  [name], DO

## 2016-03-15 NOTE — Progress Notes (Signed)
Pre visit review using our clinic review tool, if applicable. No additional management support is needed unless otherwise documented below in the visit note. 

## 2016-03-15 NOTE — Patient Instructions (Signed)
Generalized Anxiety Disorder Generalized anxiety disorder (GAD) is a mental disorder. It interferes with life functions, including relationships, work, and school. GAD is different from normal anxiety, which everyone experiences at some point in their lives in response to specific life events and activities. Normal anxiety actually helps us prepare for and get through these life events and activities. Normal anxiety goes away after the event or activity is over.  GAD causes anxiety that is not necessarily related to specific events or activities. It also causes excess anxiety in proportion to specific events or activities. The anxiety associated with GAD is also difficult to control. GAD can vary from mild to severe. People with severe GAD can have intense waves of anxiety with physical symptoms (panic attacks).  SYMPTOMS The anxiety and worry associated with GAD are difficult to control. This anxiety and worry are related to many life events and activities and also occur more days than not for 6 months or longer. People with GAD also have three or more of the following symptoms (one or more in children):  Restlessness.   Fatigue.  Difficulty concentrating.   Irritability.  Muscle tension.  Difficulty sleeping or unsatisfying sleep. DIAGNOSIS GAD is diagnosed through an assessment by your health care provider. Your health care provider will ask you questions aboutyour mood,physical symptoms, and events in your life. Your health care provider may ask you about your medical history and use of alcohol or drugs, including prescription medicines. Your health care provider may also do a physical exam and blood tests. Certain medical conditions and the use of certain substances can cause symptoms similar to those associated with GAD. Your health care provider may refer you to a mental health specialist for further evaluation. TREATMENT The following therapies are usually used to treat GAD:    Medication. Antidepressant medication usually is prescribed for long-term daily control. Antianxiety medicines may be added in severe cases, especially when panic attacks occur.   Talk therapy (psychotherapy). Certain types of talk therapy can be helpful in treating GAD by providing support, education, and guidance. A form of talk therapy called cognitive behavioral therapy can teach you healthy ways to think about and react to daily life events and activities.  Stress managementtechniques. These include yoga, meditation, and exercise and can be very helpful when they are practiced regularly. A mental health specialist can help determine which treatment is best for you. Some people see improvement with one therapy. However, other people require a combination of therapies.   This information is not intended to replace advice given to you by your health care provider. Make sure you discuss any questions you have with your health care provider.   Document Released: 10/13/2012 Document Revised: 07/09/2014 Document Reviewed: 10/13/2012 Elsevier Interactive Patient Education 2016 Elsevier Inc.  

## 2016-03-20 ENCOUNTER — Encounter: Payer: Self-pay | Admitting: Family Medicine

## 2016-03-20 NOTE — Telephone Encounter (Signed)
please advise     KP 

## 2016-03-27 ENCOUNTER — Encounter: Payer: Self-pay | Admitting: Family Medicine

## 2016-03-27 ENCOUNTER — Ambulatory Visit (INDEPENDENT_AMBULATORY_CARE_PROVIDER_SITE_OTHER): Payer: 59 | Admitting: Family Medicine

## 2016-03-27 VITALS — BP 138/95 | HR 86 | Temp 99.3°F | Ht 76.0 in | Wt 270.0 lb

## 2016-03-27 DIAGNOSIS — R002 Palpitations: Secondary | ICD-10-CM | POA: Diagnosis not present

## 2016-03-27 DIAGNOSIS — F411 Generalized anxiety disorder: Secondary | ICD-10-CM | POA: Diagnosis not present

## 2016-03-27 LAB — CBC WITH DIFFERENTIAL/PLATELET
BASOS PCT: 0.6 % (ref 0.0–3.0)
Basophils Absolute: 0 10*3/uL (ref 0.0–0.1)
EOS PCT: 1.6 % (ref 0.0–5.0)
Eosinophils Absolute: 0.1 10*3/uL (ref 0.0–0.7)
HCT: 44.5 % (ref 39.0–52.0)
Hemoglobin: 15.1 g/dL (ref 13.0–17.0)
LYMPHS ABS: 1.5 10*3/uL (ref 0.7–4.0)
Lymphocytes Relative: 22.5 % (ref 12.0–46.0)
MCHC: 33.9 g/dL (ref 30.0–36.0)
MCV: 91.7 fl (ref 78.0–100.0)
MONOS PCT: 7.4 % (ref 3.0–12.0)
Monocytes Absolute: 0.5 10*3/uL (ref 0.1–1.0)
NEUTROS ABS: 4.4 10*3/uL (ref 1.4–7.7)
NEUTROS PCT: 67.9 % (ref 43.0–77.0)
PLATELETS: 215 10*3/uL (ref 150.0–400.0)
RBC: 4.85 Mil/uL (ref 4.22–5.81)
RDW: 13.4 % (ref 11.5–15.5)
WBC: 6.5 10*3/uL (ref 4.0–10.5)

## 2016-03-27 LAB — COMPREHENSIVE METABOLIC PANEL
ALT: 29 U/L (ref 0–53)
AST: 15 U/L (ref 0–37)
Albumin: 4 g/dL (ref 3.5–5.2)
Alkaline Phosphatase: 69 U/L (ref 39–117)
BILIRUBIN TOTAL: 0.3 mg/dL (ref 0.2–1.2)
BUN: 17 mg/dL (ref 6–23)
CALCIUM: 9 mg/dL (ref 8.4–10.5)
CHLORIDE: 105 meq/L (ref 96–112)
CO2: 31 meq/L (ref 19–32)
Creatinine, Ser: 1.13 mg/dL (ref 0.40–1.50)
GFR: 77.38 mL/min (ref >60.00)
Glucose, Bld: 91 mg/dL (ref 70–99)
POTASSIUM: 3.8 meq/L (ref 3.5–5.1)
Sodium: 141 mEq/L (ref 135–145)
Total Protein: 6.8 g/dL (ref 6.0–8.3)

## 2016-03-27 LAB — TSH: TSH: 1.71 u[IU]/mL (ref 0.35–4.50)

## 2016-03-27 MED ORDER — PAROXETINE HCL 20 MG PO TABS: 20.0000 mg | ORAL_TABLET | Freq: Every day | ORAL | 5 refills | Status: DC

## 2016-03-27 NOTE — Progress Notes (Signed)
Pre visit review using our clinic review tool, if applicable. No additional management support is needed unless otherwise documented below in the visit note. 

## 2016-03-27 NOTE — Progress Notes (Signed)
Patient ID: Levi Berger, male    DOB: Apr 27, 1981  Age: 35 y.o. MRN: [id number]    Subjective:  Subjective  HPI Levi Berger presents for palpatations since decreasing his paxil.  No chest pain.  No sob.    Review of Systems  Constitutional: Negative for appetite change, diaphoresis, fatigue and unexpected weight change.  Eyes: Negative for pain, redness and visual disturbance.  Respiratory: Negative for cough, chest tightness, shortness of breath and wheezing.   Cardiovascular: Positive for palpitations. Negative for chest pain and leg swelling.  Endocrine: Negative for cold intolerance, heat intolerance, polydipsia, polyphagia and polyuria.  Genitourinary: Negative for difficulty urinating, dysuria and frequency.  Neurological: Negative for dizziness, light-headedness, numbness and headaches.    History Past Medical History:  Diagnosis Date  . Palpitations     He has a past surgical history that includes None.   His family history includes Alcohol abuse in his maternal grandfather; Arthritis in his paternal grandfather; Colon cancer in his paternal grandfather; Diabetes in his father and paternal grandfather; Heart disease in his maternal grandfather; Hypertension in his father; Lung cancer in his father.He reports that he is a non-smoker but has been exposed to tobacco smoke. He does not have any smokeless tobacco history on file. He reports that he drinks about 2.5 oz of alcohol per week . He reports that he does not use drugs.  Current Outpatient Prescriptions on File Prior to Visit  Medication Sig Dispense Refill  . ibuprofen (ADVIL,MOTRIN) 800 MG tablet Take 800 mg by mouth every 8 (eight) hours as needed.     No current facility-administered medications on file prior to visit.      Objective:  Objective  Physical Exam  Constitutional: He is oriented to person, place, and time. Vital signs are normal. He appears well-developed and well-nourished. He is sleeping.    HENT:  Head: Normocephalic and atraumatic.  Mouth/Throat: Oropharynx is clear and moist.  Eyes: EOM are normal. Pupils are equal, round, and reactive to light.  Neck: Normal range of motion. Neck supple. No thyromegaly present.  Cardiovascular: Normal rate and regular rhythm.   No murmur heard. Pulmonary/Chest: Effort normal and breath sounds normal. No respiratory distress. He has no wheezes. He has no rales. He exhibits no tenderness.  Musculoskeletal: He exhibits no edema or tenderness.  Neurological: He is alert and oriented to person, place, and time.  Skin: Skin is warm and dry.  Psychiatric: He has a normal mood and affect. His behavior is normal. Judgment and thought content normal.  Nursing note and vitals reviewed.  BP (!) 138/95 (BP Location: Left Arm, Patient Position: Sitting, Cuff Size: Large)   Pulse 86   Temp 99.3 F (37.4 C) (Oral)   Ht 6\' 4"  (1.93 m)   Wt 270 lb (122.5 kg)   SpO2 98%   BMI 32.87 kg/m  Wt Readings from Last 3 Encounters:  03/27/16 270 lb (122.5 kg)  03/15/16 271 lb (122.9 kg)  12/26/15 265 lb 12.8 oz (120.6 kg)     Lab Results  Component Value Date   WBC 7.4 12/08/2015   HGB 15.4 12/08/2015   HCT 46.3 12/08/2015   PLT 204.0 12/08/2015   GLUCOSE 91 09/29/2015   CHOL 140 09/29/2015   TRIG 73.0 09/29/2015   HDL 42.90 09/29/2015   LDLCALC 83 09/29/2015   ALT 15 09/29/2015   AST 16 09/29/2015   NA 139 09/29/2015   K 4.0 09/29/2015   CL 104 09/29/2015  CREATININE 1.11 09/29/2015   BUN 14 09/29/2015   CO2 30 09/29/2015   TSH 0.82 09/29/2015    Mr Brain Wo Contrast  Result Date: 12/17/2015 CLINICAL DATA:  Unexplained persistent illness, associated with numbness and tingling on the RIGHT side, elevated blood pressure, lightheadedness, brain fog and visual abnormalities since March 2017. EXAM: MRI HEAD WITHOUT CONTRAST TECHNIQUE: Multiplanar, multiecho pulse sequences of the brain and surrounding structures were obtained without  intravenous contrast. COMPARISON:  None. FINDINGS: No evidence for acute infarction, hemorrhage, mass lesion, hydrocephalus, or extra-axial fluid. Normal cerebral volume. No white matter disease. Pituitary, pineal, and cerebellar tonsils unremarkable. No upper cervical lesions. Flow voids are maintained throughout the carotid, basilar, and vertebral arteries. There are no areas of chronic hemorrhage. Visualized calvarium, skull base, and upper cervical osseous structures unremarkable. Scalp and extracranial soft tissues, orbits, sinuses, and mastoids show no acute process. Maxillary and sphenoid sinus retention cysts, non worrisome. IMPRESSION: Negative exam.  No cause seen for reported symptoms. Electronically Signed   By: Elsie StainJohn T Curnes M.D.   On: 12/17/2015 14:37     Assessment & Plan:  Plan  I have discontinued Mr. Hedger's PARoxetine. I am also having him start on PARoxetine. Additionally, I am having him maintain his ibuprofen.  Meds ordered this encounter  Medications  . PARoxetine (PAXIL) 20 MG tablet    Sig: Take 1 tablet (20 mg total) by mouth daily.    Dispense:  30 tablet    Refill:  5    Problem List Items Addressed This Visit      Unprioritized   Palpitations - Primary   Relevant Medications   PARoxetine (PAXIL) 20 MG tablet   Other Relevant Orders   EKG 12-Lead (Completed)   Cardiac event monitor   CBC with Differential/Platelet   TSH   Comprehensive metabolic panel   Generalized anxiety disorder    Other Visit Diagnoses   None.   consider cardiology if symptoms persists Inc paxil 20 mg daily    Follow-up: No Follow-up on file.  Donato SchultzYvonne R Lowne Chase, DO

## 2016-03-27 NOTE — Patient Instructions (Signed)

## 2016-04-04 ENCOUNTER — Telehealth: Payer: Self-pay | Admitting: Cardiovascular Disease

## 2016-04-04 ENCOUNTER — Ambulatory Visit (INDEPENDENT_AMBULATORY_CARE_PROVIDER_SITE_OTHER): Payer: 59

## 2016-04-04 DIAGNOSIS — R002 Palpitations: Secondary | ICD-10-CM | POA: Diagnosis not present

## 2016-04-04 NOTE — Telephone Encounter (Signed)
Spoke with pt. He reports [facility]. I told him I would research and call him back. I determined [facility] was [facility] care coordination and was to schedule event monitor.  I spoke with Lamar LaundrySonya and she will contact pt.

## 2016-04-04 NOTE — Telephone Encounter (Signed)
New message ° ° ° ° ° ° °Pt returning nurse call  °

## 2016-04-24 ENCOUNTER — Ambulatory Visit: Payer: 59 | Admitting: Family Medicine

## 2016-05-12 ENCOUNTER — Other Ambulatory Visit: Payer: Self-pay | Admitting: Family Medicine

## 2016-05-12 DIAGNOSIS — R002 Palpitations: Secondary | ICD-10-CM

## 2016-05-16 ENCOUNTER — Ambulatory Visit (INDEPENDENT_AMBULATORY_CARE_PROVIDER_SITE_OTHER): Payer: 59 | Admitting: Cardiology

## 2016-05-16 ENCOUNTER — Encounter: Payer: Self-pay | Admitting: Cardiology

## 2016-05-16 VITALS — BP 134/87 | HR 88 | Ht 74.0 in | Wt 273.6 lb

## 2016-05-16 DIAGNOSIS — I491 Atrial premature depolarization: Secondary | ICD-10-CM | POA: Diagnosis not present

## 2016-05-16 DIAGNOSIS — F411 Generalized anxiety disorder: Secondary | ICD-10-CM

## 2016-05-16 DIAGNOSIS — R42 Dizziness and giddiness: Secondary | ICD-10-CM | POA: Diagnosis not present

## 2016-05-16 DIAGNOSIS — R002 Palpitations: Secondary | ICD-10-CM

## 2016-05-16 NOTE — Progress Notes (Signed)
PCP: [name], DO  Clinic Note: Chief Complaint  Patient presents with  . New Patient (Initial Visit)    Palpitations, no chest pain.    HPI: Levi Berger is a 35 y.o. male with a PMH below who presents today for evaluation of palpitations. He was pre-was evaluated for similar symptoms back in 2013 by Dr. Clifton James. An echocardiogram was done that was essentially normal. Expectant management was chosen.  He was seen by his PCP most recently on September 26. This is a follow-up having some persistent palpitations. He wore an event monitor with the results noted below.  Recent Hospitalizations: None  Studies Reviewed:  30d Monitor Oct 2017:  The patient was enrolled for 30 days. 133 transmissions occurred during the monitoring period.  Predominant rhythm was sinus with occasional premature atrial contractions.  Some premature atrial contractions were not conducted, with an R-R interval up of to ~1.6 seconds.  First degree AV block and one episode of type 1 second degree AV block (Wenkebach) were noted.  No sustained arrhythmia or prolonged pauses were identified.  Patient triggered events corresponded to sinus rhythm with PACs (some PACs were not conducted).    Transthoracic Echo August 2013: Normal LV size and thickness. Normal wall motion with EF of 55-60%. Possible grade 2 diastolic dysfunction. Otherwise normal valve function.  Interval History: Levi Berger presents for follow-up after his event monitor showed essentially frequent PACs some of them blocked with a question of possible Wenkebach block. No arrhythmia noted. 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 He said that since about March she's been noticing having spells of palpitations that sort of come and go last maybe a few minutes and then can go away then come back. Over the last few weeks they've actually been less notable, partly because he have the  results of his monitor. He says that he feels a discomfort in his chest with the palpitations only, but not otherwise. He has no resting or exertional dyspnea. Other than that. No chest tightness or pressure. Levi Berger will note though is feeling little bit of fatigue when he's having a spell. The prolonged spell actually maybe even make sense of uneasiness in his chest where he is a desire to take a deep breath to make it stop.   What he describes as feeling more the sensation of the post block PA-C pauses and then actually palpitations. Otherwise he feels the compensatory pause.  Otherwise, from a cardiac standpoint he is not had any dizziness wooziness or syncope/near-syncope symptoms associated with these palpitations. No chest tightness or pressure with rest or exertion. No PND, orthopnea or edema.  No TIA/amaurosis fugax symptoms. No claudication.  ROS: A comprehensive was performed. ROS   Past Medical History:  Diagnosis Date  . Generalized anxiety disorder 12/26/2015  . Palpitations     Past Surgical History:  Procedure Laterality Date  . None    . TRANSTHORACIC ECHOCARDIOGRAM  01/2012   Normal LV size and thickness. Normal wall motion with EF of 55-60%. Possible grade 2 diastolic dysfunction. Otherwise normal valve function.   Current Meds  Medication Sig  . ibuprofen (ADVIL,MOTRIN) 800 MG tablet Take 800 mg by mouth every 8 (eight) hours as needed.  Marland Kitchen PARoxetine (PAXIL) 20 MG tablet Take 1 tablet (20 mg total) by mouth daily.   No Known Allergies   Social History   Social History  . Marital status:  Single    Spouse name: N/A  . Number of children: 0  . Years of education: N/A   Occupational History  . [employer] Unemployed   Social History Main Topics  . Smoking status: Passive Smoke Exposure - Never Smoker    Types: Pipe  . Smokeless tobacco: Former NeurosurgeonUser     Comment: pt occ smokes a pipe maybe once or twice a month  . Alcohol use 2.5 oz/week    5 Standard  drinks or equivalent per week     Comment: ocassionally  . Drug use: No  . Sexual activity: Not Asked   Other Topics Concern  . None   Social History Narrative  . None   Family History  Problem Relation Age of Onset  . Lung cancer Father     Smoker  . Hypertension Father   . Diabetes Father   . Arthritis Paternal Grandfather   . Colon cancer Paternal Grandfather   . Diabetes Paternal Grandfather   . Heart disease Maternal Grandfather   . Alcohol abuse Maternal Grandfather     Wt Readings from Last 3 Encounters:  05/16/16 124.1 kg (273 lb 9.6 oz)  03/27/16 122.5 kg (270 lb)  03/15/16 122.9 kg (271 lb)    PHYSICAL EXAM BP 134/87   Pulse 88   Ht 6\' 2"  (1.88 m)   Wt 124.1 kg (273 lb 9.6 oz)   BMI 35.13 kg/m  General appearance: alert, cooperative, appears stated age, no distress and Moderately obese; well groomed. Normal mood and affect. A little bit anxious. HEENT: [place]/AT, EOMI, MMM, anicteric sclera Neck: no adenopathy, no carotid bruit and no JVD Lungs: clear to auscultation bilaterally, normal percussion bilaterally and non-labored Heart: regular rate and rhythm - with occasional ectopy noted, S1 and S2 normal, no murmur, click, rub or gallop; nondisplaced PMI Abdomen: soft, non-tender; bowel sounds normal; no masses,  no organomegaly; no HJR Extremities: extremities normal, atraumatic, no cyanosis, or edema  Pulses: 2+ and symmetric Skin: mobility and turgor normal, no edema, no evidence of bleeding or bruising and no lesions noted Neurologic: Mental status: Alert, oriented, thought content appropriate; Cranial nerves: normal (II-XII grossly intact). Normal gait    Adult ECG Report  Rate: 78 ;  Rhythm: normal sinus rhythm and Normal axis, intervals and durations. (PR interval 188);   Narrative Interpretation: Normal EKG   Other studies Reviewed: Additional studies/ records that were reviewed today include:  Recent Labs:     Chemistry      Component Value  Date/Time   NA 141 03/27/2016 1358   K 3.8 03/27/2016 1358   CL 105 03/27/2016 1358   CO2 31 03/27/2016 1358   BUN 17 03/27/2016 1358   CREATININE 1.13 03/27/2016 1358      Component Value Date/Time   CALCIUM 9.0 03/27/2016 1358   ALKPHOS 69 03/27/2016 1358   AST 15 03/27/2016 1358   ALT 29 03/27/2016 1358   BILITOT 0.3 03/27/2016 1358       ASSESSMENT / PLAN: Problem List Items Addressed This Visit    Blocked premature atrial contraction (Chronic)    Noted on monitor. I think he feels the compensatory pause after the block PA-C. Would be reluctant to treat with medicines that would potentially give him more side effects than benefit.      Palpitations - Primary    Previously evaluated by Dr. Clifton JamesMcAlhany back in 2013 for these episodes. Thought to be PVCs or PACs at that time. Now his monitor shows  relatively frequent PACs, and appears that some of them are blocked. As result, I'll be reluctant to use a beta blocker or calcium channel blocker as this may certainly make these worse. I would prefer to probably not treat but just simply monitor. The relatively benign. The go away with exertion.  We will just simply reassess in a few months to see how is doing. We could consider flecainide as an option.      Relevant Orders   EKG 12-Lead   Dizziness   Relevant Orders   EKG 12-Lead   Generalized anxiety disorder    I think this may be one of the driving forces for his palpitations. He just started on Paxil. I think some of the symptoms happened when he was adjusting his dose. He thinks there may be some correlation. Probably not likely, but I think once his anxiety level is lower, these palpitations will improve. Knowing that they're benign will also help.         Current medicines are reviewed at length with the patient today. (+/- concerns) none The following changes have been made:   Patient Instructions  NO CHANGES WITH CURRENT TREATMENT.  CONTINUE TO MONITOR HEART  RATE.   Your physician recommends that you schedule a follow-up appointment in: FEB- MARCH  2018 with Dr Herbie BaltimoreHarding.    Studies Ordered:   Orders Placed This Encounter  Procedures  . EKG 12-Lead      Bryan Lemmaavid Harding, M.D., M.S. Interventional Cardiologist   Pager # [phone] Phone # [phone] [address] [place], [place] [address]

## 2016-05-16 NOTE — Patient Instructions (Addendum)
NO CHANGES WITH CURRENT TREATMENT.  CONTINUE TO MONITOR HEART RATE.   Your physician recommends that you schedule a follow-up appointment in: FEB- MARCH  2018 with Dr Herbie BaltimoreHarding.

## 2016-05-17 ENCOUNTER — Encounter: Payer: Self-pay | Admitting: Cardiology

## 2016-05-17 DIAGNOSIS — I491 Atrial premature depolarization: Secondary | ICD-10-CM | POA: Insufficient documentation

## 2016-05-17 NOTE — Assessment & Plan Note (Signed)
Previously evaluated by Dr. Clifton JamesMcAlhany back in 2013 for these episodes. Thought to be PVCs or PACs at that time. Now his monitor shows relatively frequent PACs, and appears that some of them are blocked. As result, I'll be reluctant to use a beta blocker or calcium channel blocker as this may certainly make these worse. I would prefer to probably not treat but just simply monitor. The relatively benign. The go away with exertion.  We will just simply reassess in a few months to see how is doing. We could consider flecainide as an option.

## 2016-05-17 NOTE — Assessment & Plan Note (Signed)
Noted on monitor. I think he feels the compensatory pause after the block PA-C. Would be reluctant to treat with medicines that would potentially give him more side effects than benefit.

## 2016-05-17 NOTE — Assessment & Plan Note (Signed)
I think this may be one of the driving forces for his palpitations. He just started on Paxil. I think some of the symptoms happened when he was adjusting his dose. He thinks there may be some correlation. Probably not likely, but I think once his anxiety level is lower, these palpitations will improve. Knowing that they're benign will also help.

## 2016-08-09 ENCOUNTER — Encounter: Payer: Self-pay | Admitting: Cardiology

## 2016-08-09 ENCOUNTER — Ambulatory Visit (INDEPENDENT_AMBULATORY_CARE_PROVIDER_SITE_OTHER): Payer: 59 | Admitting: Cardiology

## 2016-08-09 VITALS — BP 140/90 | HR 73 | Ht 74.0 in | Wt 270.0 lb

## 2016-08-09 DIAGNOSIS — R002 Palpitations: Secondary | ICD-10-CM

## 2016-08-09 DIAGNOSIS — I491 Atrial premature depolarization: Secondary | ICD-10-CM

## 2016-08-09 NOTE — Assessment & Plan Note (Signed)
Probably still has the PVCs and PACs noted before. Unfortunately however there were some intermittent Wenkebach block and first 3 AV block noted on the monitor back in 2013, therefore I would be reluctant to add a beta blocker. He also has been noticing more fatigue than usual which would also be a reason not to use a beta blocker. Calcium channel blockers would not likely be all that helpful.  Would simply monitor and treat when necessary.

## 2016-08-09 NOTE — Patient Instructions (Signed)
NO CHANGE WITH CURRENT TREATMENT.     Your physician recommends that you schedule a follow-up appointment on as needed basis.

## 2016-08-09 NOTE — Progress Notes (Signed)
PCP: [name], DO  Clinic Note: Chief Complaint  Patient presents with  . Follow-up    2-66months;  . Dizziness    occassionally    HPI: Levi Berger is a 36 y.o. male with a PMH below who presents today for Follow-up visit after being evaluated for palpitations in November 2017. Evaluated for similar symptoms by Dr. Clifton James in 2013. Abnormal echocardiogram. 30 day monitor showed PVCs with occasional first-degree AV block and very infrequent Wenkebach block.  Recent Hospitalizations: None  Studies Reviewed: No new studies  Interval History: Levi Berger presents today for follow-up and review of symptoms. He says he had one episode last night when he would've been working out really hard refill a quick burst of fast heartbeats of maybe 3-4 a row only. Otherwise really has noticed that the palpitations have been more controlled of late. He may still notice some but not overly bothersome or worrisome to him. I had any prolonged episodes of rapid irregular heartbeats. He occasionally gets a little bit dizzy with some positioning changes as well as if he over-exerts himself.  No chest pain or shortness of breath with rest or exertion. No PND, orthopnea or edema.  No lightheadedness, weakness or syncope/near syncope. No TIA/amaurosis fugax symptoms. No melena, hematochezia, hematuria, or epstaxis. No claudication.  ROS: A comprehensive was performed. Review of Systems  Constitutional:       He states that he does get a little tired and little bit easier than he used to.  Psychiatric/Behavioral: The patient is nervous/anxious (But notably improved).   All other systems reviewed and are negative.    Past Medical History:  Diagnosis Date  . Generalized anxiety disorder 12/26/2015  . Palpitations     Past Surgical History:  Procedure Laterality Date  . None    . TRANSTHORACIC ECHOCARDIOGRAM  01/2012   Normal LV size and thickness. Normal wall motion with EF of  55-60%. Possible grade 2 diastolic dysfunction. Otherwise normal valve function.    Current Meds  Medication Sig  . ibuprofen (ADVIL,MOTRIN) 800 MG tablet Take 800 mg by mouth every 8 (eight) hours as needed.  Marland Kitchen PARoxetine (PAXIL) 20 MG tablet Take 1 tablet (20 mg total) by mouth daily.    No Known Allergies  Social History   Social History  . Marital status: Single    Spouse name: N/A  . Number of children: 0  . Years of education: N/A   Occupational History  . [employer] Unemployed   Social History Main Topics  . Smoking status: Passive Smoke Exposure - Never Smoker    Types: Pipe  . Smokeless tobacco: Former Neurosurgeon     Comment: pt occ smokes a pipe maybe once or twice a month  . Alcohol use 2.5 oz/week    5 Standard drinks or equivalent per week     Comment: ocassionally  . Drug use: No  . Sexual activity: Not Asked   Other Topics Concern  . None   Social History Narrative  . None    family history includes Alcohol abuse in his maternal grandfather; Arthritis in his paternal grandfather; Colon cancer in his paternal grandfather; Diabetes in his father and paternal grandfather; Heart disease in his maternal grandfather; Hypertension in his father; Lung cancer in his father.  Wt Readings from Last 3 Encounters:  08/09/16 122.5 kg (270 lb)  05/16/16 124.1 kg (273 lb 9.6 oz)  03/27/16 122.5 kg (270 lb)    PHYSICAL EXAM BP 140/90  Pulse 73   Ht 6\' 2"  (1.88 m)   Wt 122.5 kg (270 lb)   BMI 34.67 kg/m  General appearance: alert, cooperative, appears stated age, no distress and Moderately obese; well groomed. Normal mood and affect. A little bit anxious. HEENT: [place]/AT, EOMI, MMM, anicteric sclera Neck: no adenopathy, no carotid bruit and no JVD Lungs: clear to auscultation bilaterally, normal percussion bilaterally and non-labored Heart: regular rate and rhythm - with occasional ectopy noted, S1 and S2 normal, no murmur, click, rub or gallop; nondisplaced  PMI Abdomen: soft, non-tender; bowel sounds normal; no masses,  no organomegaly; no HJR Neurologic: Mental status: Alert, oriented, thought content appropriate;Normal gait   Adult ECG Report  n/a  Other studies Reviewed: Additional studies/ records that were reviewed today include:  Recent Labs:  n/a     ASSESSMENT / PLAN: Problem List Items Addressed This Visit    Blocked premature atrial contraction (Chronic)    I think this may been what it was some concern for possible Wenkebach block. PACs are relatively benign, and he is not symptomatic, would simply monitor for now. Would be reluctant to use an AV nodal agent.      Palpitations - Primary    Probably still has the PVCs and PACs noted before. Unfortunately however there were some intermittent Wenkebach block and first 3 AV block noted on the monitor back in 2013, therefore I would be reluctant to add a beta blocker. He also has been noticing more fatigue than usual which would also be a reason not to use a beta blocker. Calcium channel blockers would not likely be all that helpful.  Would simply monitor and treat when necessary.         Current medicines are reviewed at length with the patient today. (+/- concerns) None The following changes have been made: None  Patient Instructions  NO CHANGE WITH CURRENT TREATMENT.     Your physician recommends that you schedule a follow-up appointment on as needed basis.       Studies Ordered:   No orders of the defined types were placed in this encounter.     Bryan Lemmaavid Harding, M.D., M.S. Interventional Cardiologist   Pager # [phone] Phone # [phone] [address] [place], [place] [address]

## 2016-08-09 NOTE — Assessment & Plan Note (Addendum)
I think this may been what it was some concern for possible Wenkebach block. PACs are relatively benign, and he is not symptomatic, would simply monitor for now. Would be reluctant to use an AV nodal agent.

## 2016-09-11 ENCOUNTER — Ambulatory Visit (INDEPENDENT_AMBULATORY_CARE_PROVIDER_SITE_OTHER): Payer: 59 | Admitting: Family Medicine

## 2016-09-11 ENCOUNTER — Encounter: Payer: Self-pay | Admitting: Family Medicine

## 2016-09-11 VITALS — BP 136/82 | HR 69 | Temp 98.6°F | Resp 16 | Ht 76.0 in | Wt 281.4 lb

## 2016-09-11 DIAGNOSIS — R002 Palpitations: Secondary | ICD-10-CM

## 2016-09-11 DIAGNOSIS — R42 Dizziness and giddiness: Secondary | ICD-10-CM | POA: Diagnosis not present

## 2016-09-11 DIAGNOSIS — G4700 Insomnia, unspecified: Secondary | ICD-10-CM | POA: Diagnosis not present

## 2016-09-11 DIAGNOSIS — F411 Generalized anxiety disorder: Secondary | ICD-10-CM | POA: Diagnosis not present

## 2016-09-11 DIAGNOSIS — R5383 Other fatigue: Secondary | ICD-10-CM

## 2016-09-11 DIAGNOSIS — F5105 Insomnia due to other mental disorder: Secondary | ICD-10-CM

## 2016-09-11 MED ORDER — PAROXETINE HCL 20 MG PO TABS: 20.0000 mg | ORAL_TABLET | Freq: Every day | ORAL | 8 refills | Status: DC

## 2016-09-11 MED ORDER — IBUPROFEN 800 MG PO TABS: 800.0000 mg | ORAL_TABLET | Freq: Three times a day (TID) | ORAL | 3 refills | Status: DC | PRN

## 2016-09-11 NOTE — Patient Instructions (Signed)
Generalized Anxiety Disorder, Adult Generalized anxiety disorder (GAD) is a mental health disorder. People with this condition constantly worry about everyday events. Unlike normal anxiety, worry related to GAD is not triggered by a specific event. These worries also do not fade or get better with time. GAD interferes with life functions, including relationships, work, and school. GAD can vary from mild to severe. People with severe GAD can have intense waves of anxiety with physical symptoms (panic attacks). What are the causes? The exact cause of GAD is not known. What increases the risk? This condition is more likely to develop in:  Women.  People who have a family history of anxiety disorders.  People who are very shy.  People who experience very stressful life events, such as the death of a loved one.  People who have a very stressful family environment. What are the signs or symptoms? People with GAD often worry excessively about many things in their lives, such as their health and family. They may also be overly concerned about:  Doing well at work.  Being on time.  Natural disasters.  Friendships. Physical symptoms of GAD include:  Fatigue.  Muscle tension or having muscle twitches.  Trembling or feeling shaky.  Being easily startled.  Feeling like your heart is pounding or racing.  Feeling out of breath or like you cannot take a deep breath.  Having trouble falling asleep or staying asleep.  Sweating.  Nausea, diarrhea, or irritable bowel syndrome (IBS).  Headaches.  Trouble concentrating or remembering facts.  Restlessness.  Irritability. How is this diagnosed? Your health care provider can diagnose GAD based on your symptoms and medical history. You will also have a physical exam. The health care provider will ask specific questions about your symptoms, including how severe they are, when they started, and if they come and go. Your health care  provider may ask you about your use of alcohol or drugs, including prescription medicines. Your health care provider may refer you to a mental health specialist for further evaluation. Your health care provider will do a thorough examination and may perform additional tests to rule out other possible causes of your symptoms. To be diagnosed with GAD, a person must have anxiety that:  Is out of his or her control.  Affects several different aspects of his or her life, such as work and relationships.  Causes distress that makes him or her unable to take part in normal activities.  Includes at least three physical symptoms of GAD, such as restlessness, fatigue, trouble concentrating, irritability, muscle tension, or sleep problems. Before your health care provider can confirm a diagnosis of GAD, these symptoms must be present more days than they are not, and they must last for six months or longer. How is this treated? The following therapies are usually used to treat GAD:  Medicine. Antidepressant medicine is usually prescribed for long-term daily control. Antianxiety medicines may be added in severe cases, especially when panic attacks occur.  Talk therapy (psychotherapy). Certain types of talk therapy can be helpful in treating GAD by providing support, education, and guidance. Options include:  Cognitive behavioral therapy (CBT). People learn coping skills and techniques to ease their anxiety. They learn to identify unrealistic or negative thoughts and behaviors and to replace them with positive ones.  Acceptance and commitment therapy (ACT). This treatment teaches people how to be mindful as a way to cope with unwanted thoughts and feelings.  Biofeedback. This process trains you to manage your body's response (  physiological response) through breathing techniques and relaxation methods. You will work with a therapist while machines are used to monitor your physical symptoms.  Stress  management techniques. These include yoga, meditation, and exercise. A mental health specialist can help determine which treatment is best for you. Some people see improvement with one type of therapy. However, other people require a combination of therapies. Follow these instructions at home:  Take over-the-counter and prescription medicines only as told by your health care provider.  Try to maintain a normal routine.  Try to anticipate stressful situations and allow extra time to manage them.  Practice any stress management or self-calming techniques as taught by your health care provider.  Do not punish yourself for setbacks or for not making progress.  Try to recognize your accomplishments, even if they are small.  Keep all follow-up visits as told by your health care provider. This is important. Contact a health care provider if:  Your symptoms do not get better.  Your symptoms get worse.  You have signs of depression, such as:  A persistently sad, cranky, or irritable mood.  Loss of enjoyment in activities that used to bring you joy.  Change in weight or eating.  Changes in sleeping habits.  Avoiding friends or family members.  Loss of energy for normal tasks.  Feelings of guilt or worthlessness. Get help right away if:  You have serious thoughts about hurting yourself or others. If you ever feel like you may hurt yourself or others, or have thoughts about taking your own life, get help right away. You can go to your nearest emergency department or call:  Your local emergency services (911 in the [place]  A suicide crisis helpline, such as the National Suicide Prevention Lifeline at 1-800-273-8255. This is open 24 hours a day. Summary  Generalized anxiety disorder (GAD) is a mental health disorder that involves worry that is not triggered by a specific event.  People with GAD often worry excessively about many things in their lives, such as their health and  family.  GAD may cause physical symptoms such as restlessness, trouble concentrating, sleep problems, frequent sweating, nausea, diarrhea, headaches, and trembling or muscle twitching.  A mental health specialist can help determine which treatment is best for you. Some people see improvement with one type of therapy. However, other people require a combination of therapies. This information is not intended to replace advice given to you by your health care provider. Make sure you discuss any questions you have with your health care provider. Document Released: 10/13/2012 Document Revised: 05/08/2016 Document Reviewed: 05/08/2016 Elsevier Interactive Patient Education  2017 [employer] Inc[employer]  

## 2016-09-11 NOTE — Assessment & Plan Note (Signed)
Daytime sleepiness and snoring--- refer for possible sleep study

## 2016-09-11 NOTE — Progress Notes (Signed)
Subjective:  I acted as a Neurosurgeon for Dr. Delman Kitten, LPN    Patient ID: Levi Berger, male    DOB: 1981/04/04, 36 y.o.   MRN: [id number]  Chief Complaint  Patient presents with  . Anxiety    follow up    Anxiety  Presents for follow-up visit. Patient reports no chest pain or palpitations.      Patient is in today for follow up anxiety. Patient c/o being tired more then usual since he started paxil and getting worse. Patient also report when he is doing intense work like working out, running up and downstairs, and mowing the lawn he fill light headed for about 5 to 6 months ago.  Patient Care Team: Levi Schultz, DO as PCP - General (Family Medicine)   Past Medical History:  Diagnosis Date  . Generalized anxiety disorder 12/26/2015  . Palpitations     Past Surgical History:  Procedure Laterality Date  . None    . TRANSTHORACIC ECHOCARDIOGRAM  01/2012   Normal LV size and thickness. Normal wall motion with EF of 55-60%. Possible grade 2 diastolic dysfunction. Otherwise normal valve function.    Family History  Problem Relation Age of Onset  . Lung cancer Father     Smoker  . Hypertension Father   . Diabetes Father   . Arthritis Paternal Grandfather   . Colon cancer Paternal Grandfather   . Diabetes Paternal Grandfather   . Heart disease Maternal Grandfather   . Alcohol abuse Maternal Grandfather     Social History   Social History  . Marital status: Single    Spouse name: N/A  . Number of children: 0  . Years of education: N/A   Occupational History  . [employer] Unemployed   Social History Main Topics  . Smoking status: Passive Smoke Exposure - Never Smoker    Types: Pipe  . Smokeless tobacco: Former Neurosurgeon     Comment: pt occ smokes a pipe maybe once or twice a month  . Alcohol use 2.5 oz/week    5 Standard drinks or equivalent per week     Comment: ocassionally  . Drug use: No  . Sexual activity: Not on file   Other Topics  Concern  . Not on file   Social History Narrative  . No narrative on file    Outpatient Medications Prior to Visit  Medication Sig Dispense Refill  . ibuprofen (ADVIL,MOTRIN) 800 MG tablet Take 800 mg by mouth every 8 (eight) hours as needed.    Marland Kitchen PARoxetine (PAXIL) 20 MG tablet Take 1 tablet (20 mg total) by mouth daily. 30 tablet 5   No facility-administered medications prior to visit.     No Known Allergies  Review of Systems  Constitutional: Negative for fever.  HENT: Negative for congestion.   Eyes: Negative for blurred vision.  Respiratory: Negative for cough.   Cardiovascular: Negative for chest pain and palpitations.  Gastrointestinal: Negative for vomiting.  Musculoskeletal: Negative for back pain.  Skin: Negative for rash.  Neurological: Negative for loss of consciousness and headaches.       Objective:    Physical Exam  Constitutional: He is oriented to person, place, and time. He appears well-developed and well-nourished. No distress.  HENT:  Head: Normocephalic and atraumatic.  Eyes: Conjunctivae are normal.  Neck: Normal range of motion. No thyromegaly present.  Cardiovascular: Normal rate and regular rhythm.   Pulmonary/Chest: Effort normal and breath sounds normal. He has no wheezes.  Abdominal: Soft. Bowel sounds are normal. There is no tenderness.  Musculoskeletal: Normal range of motion. He exhibits no edema or deformity.  Neurological: He is alert and oriented to person, place, and time.  Skin: Skin is warm and dry. He is not diaphoretic.  Psychiatric: He has a normal mood and affect. His behavior is normal. Judgment and thought content normal.  Nursing note and vitals reviewed.   BP 136/82 (BP Location: Right Arm, Patient Position: Sitting, Cuff Size: Large)   Pulse 69   Temp 98.6 F (37 C) (Oral)   Resp 16   Ht 6\' 4"  (1.93 m)   Wt 281 lb 6.4 oz (127.6 kg)   SpO2 97%   BMI 34.25 kg/m  Wt Readings from Last 3 Encounters:  09/11/16 281 lb  6.4 oz (127.6 kg)  08/09/16 270 lb (122.5 kg)  05/16/16 273 lb 9.6 oz (124.1 kg)     Lab Results  Component Value Date   WBC 6.5 03/27/2016   HGB 15.1 03/27/2016   HCT 44.5 03/27/2016   PLT 215.0 03/27/2016   GLUCOSE 91 03/27/2016   CHOL 140 09/29/2015   TRIG 73.0 09/29/2015   HDL 42.90 09/29/2015   LDLCALC 83 09/29/2015   ALT 29 03/27/2016   AST 15 03/27/2016   NA 141 03/27/2016   K 3.8 03/27/2016   CL 105 03/27/2016   CREATININE 1.13 03/27/2016   BUN 17 03/27/2016   CO2 31 03/27/2016   TSH 1.71 03/27/2016    Lab Results  Component Value Date   TSH 1.71 03/27/2016   Lab Results  Component Value Date   WBC 6.5 03/27/2016   HGB 15.1 03/27/2016   HCT 44.5 03/27/2016   MCV 91.7 03/27/2016   PLT 215.0 03/27/2016   Lab Results  Component Value Date   NA 141 03/27/2016   K 3.8 03/27/2016   CO2 31 03/27/2016   GLUCOSE 91 03/27/2016   BUN 17 03/27/2016   CREATININE 1.13 03/27/2016   BILITOT 0.3 03/27/2016   ALKPHOS 69 03/27/2016   AST 15 03/27/2016   ALT 29 03/27/2016   PROT 6.8 03/27/2016   ALBUMIN 4.0 03/27/2016   CALCIUM 9.0 03/27/2016   ANIONGAP 11 09/22/2015   GFR 77.38 03/27/2016   Lab Results  Component Value Date   CHOL 140 09/29/2015   Lab Results  Component Value Date   HDL 42.90 09/29/2015   Lab Results  Component Value Date   LDLCALC 83 09/29/2015   Lab Results  Component Value Date   TRIG 73.0 09/29/2015   Lab Results  Component Value Date   CHOLHDL 3 09/29/2015   No results found for: HGBA1C     Assessment & Plan:   Problem List Items Addressed This Visit      Unprioritized   Palpitations   Relevant Medications   PARoxetine (PAXIL) 20 MG tablet   Fatigue    Daytime sleepiness and snoring--- refer for possible sleep study      Generalized anxiety disorder    Doing well  con't paxil       Other Visit Diagnoses    Light headed    -  Primary   Relevant Orders   EKG 12-Lead (Completed)   Insomnia disorder, with  non-sleep disorder mental comorbidity, recurrent       Insomnia, unspecified type       Relevant Orders   Ambulatory referral to Pulmonology      I have changed Mr. Levi Berger's ibuprofen. I am also having  him maintain his PARoxetine.  Meds ordered this encounter  Medications  . ibuprofen (ADVIL,MOTRIN) 800 MG tablet    Sig: Take 1 tablet (800 mg total) by mouth every 8 (eight) hours as needed.    Dispense:  30 tablet    Refill:  3  . PARoxetine (PAXIL) 20 MG tablet    Sig: Take 1 tablet (20 mg total) by mouth daily.    Dispense:  30 tablet    Refill:  8    CMA served as scribe during this visit. History, Physical and Plan performed by medical provider. Documentation and orders reviewed and attested to.  Levi Schultz, DO   Patient ID: Levi Berger, male   DOB: August 28, 1980, 36 y.o.   MRN: [id number]

## 2016-09-11 NOTE — Progress Notes (Signed)
Pre visit review using our clinic review tool, if applicable. No additional management support is needed unless otherwise documented below in the visit note. 

## 2016-09-11 NOTE — Assessment & Plan Note (Signed)
Doing well  con't paxil

## 2016-10-31 ENCOUNTER — Ambulatory Visit (INDEPENDENT_AMBULATORY_CARE_PROVIDER_SITE_OTHER): Payer: 59 | Admitting: Pulmonary Disease

## 2016-10-31 ENCOUNTER — Encounter: Payer: Self-pay | Admitting: Pulmonary Disease

## 2016-10-31 VITALS — BP 132/82 | HR 71 | Ht 74.0 in | Wt 272.0 lb

## 2016-10-31 DIAGNOSIS — G4726 Circadian rhythm sleep disorder, shift work type: Secondary | ICD-10-CM | POA: Diagnosis not present

## 2016-10-31 DIAGNOSIS — R0683 Snoring: Secondary | ICD-10-CM | POA: Diagnosis not present

## 2016-10-31 DIAGNOSIS — R400 Somnolence: Secondary | ICD-10-CM

## 2016-10-31 NOTE — Progress Notes (Signed)
Past Surgical History He  has a past surgical history that includes None and transthoracic echocardiogram (01/2012).  No Known Allergies  Family History His family history includes Alcohol abuse in his maternal grandfather; Arthritis in his paternal grandfather; Colon cancer in his paternal grandfather; Diabetes in his father and paternal grandfather; Heart disease in his maternal grandfather; Hypertension in his father; Lung cancer in his father.  Social History He  reports that he is a non-smoker but has been exposed to tobacco smoke. He has quit using smokeless tobacco. He reports that he drinks about 2.5 oz of alcohol per week . He reports that he does not use drugs.  Review of systems Constitutional: Negative for fever and unexpected weight change.  HENT: Negative for congestion, dental problem, ear pain, nosebleeds, postnasal drip, rhinorrhea, sinus pressure, sneezing, sore throat and trouble swallowing.   Eyes: Negative for redness and itching.  Respiratory: Negative for cough, chest tightness, shortness of breath and wheezing.   Cardiovascular: Negative for palpitations and leg swelling.  Gastrointestinal: Negative for nausea and vomiting.       Indigestion  Genitourinary: Negative for dysuria.  Musculoskeletal: Positive for arthralgias. Negative for joint swelling.  Skin: Negative for rash.  Neurological: Negative for headaches.  Hematological: Does not bruise/bleed easily.  Psychiatric/Behavioral: Negative for dysphoric mood. The patient is nervous/anxious.     Current Outpatient Prescriptions on File Prior to Visit  Medication Sig  . ibuprofen (ADVIL,MOTRIN) 800 MG tablet Take 1 tablet (800 mg total) by mouth every 8 (eight) hours as needed.  Marland Kitchen PARoxetine (PAXIL) 20 MG tablet Take 1 tablet (20 mg total) by mouth daily.   No current facility-administered medications on file prior to visit.     Chief Complaint  Patient presents with  . SLEEP CONSULT    Referred by Dr  Zola Button for insomnia. Epworth Score: 11    Cardiac tests Echo 02/15/12 >> EF 55 to 60%, grade 2 DD  Past medical history He  has a past medical history of Generalized anxiety disorder (12/26/2015) and Palpitations.  Vital signs BP 132/82 (BP Location: Left Arm, Cuff Size: Normal)   Pulse 71   Ht  (1.88 m)   Wt 272 lb (123.4 kg)   SpO2 99%   BMI 34.92 kg/m   History of Present Illness Levi Berger is a 36 y.o. male for evaluation of sleep problems.  He works as a Emergency planning/management officer.  He has rotating shifts every two weeks.  He gets about 8 hours sleep per day.  If he didn't have to work his normal sleep pattern would be going to bed at 3 am and walking up at 1 pm.  He takes melatonin sometimes to help him sleep.  He snores.  He will grind his teeth at night.  He has trouble sleeping on his back.  He does dream occasionally.  He drinks coffee frequently during the day.  His mother history of a fib and sleep apnea.  He was seen by cardiology for palpitations and found to have PACs.  He was started on paxil recently for anxiety.  He was getting jittery legs also, and this has improved with paxil.  He denies sleep walking, sleep talking, bruxism, or nightmares.  There is no history of restless legs.  He denies sleep hallucinations, sleep paralysis, or cataplexy.  The Epworth score is 11 out of 24.   Physical Exam:  General - No distress ENT - No sinus tenderness, no oral exudate, no LAN, no  thyromegaly, TM clear, pupils equal/reactive, MP 2, low laying soft palate Cardiac - s1s2 regular, no murmur, pulses symmetric Chest - No wheeze/rales/dullness, good air entry, normal respiratory excursion Back - No focal tenderness Abd - Soft, non-tender, no organomegaly, + bowel sounds Ext - No edema Neuro - Normal strength, cranial nerves intact Skin - No rashes Psych - Normal mood, and behavior  Discussion: He has snoring, daytime sleepiness, and sleep disruption.  He has shift  work.  He has history of anxiety and previous Echo showed diastolic dysfunction.  He has family history of sleep apnea.  He could also have sleep apnea contributing to his symptoms.  We discussed how sleep apnea can affect various health problems, including risks for hypertension, cardiovascular disease, and diabetes.  We also discussed how sleep disruption can increase risks for accidents, such as while driving.  Weight loss as a means of improving sleep apnea was also reviewed.  Additional treatment options discussed were CPAP therapy, oral appliance, and surgical intervention.  Assessment/plan:  Snoring with concern for sleep apnea. - will arrange for home sleep study  Shift work sleep disordered. - discuss sleep hygiene and maintaining regular sleep patterns as best as possible - discussed judicious use of melatonin and timing of light exposure   Patient Instructions  Will arrange for home sleep study Will call to arrange for follow up after sleep study reviewed    Coralyn Helling, M.D. Pager [phone] 10/31/2016, 11:39 AM

## 2016-10-31 NOTE — Progress Notes (Signed)
   Subjective:    Patient ID: Levi Berger, male    DOB: 03-02-1981, 36 y.o.   MRN: [id number]  HPI    Review of Systems  Constitutional: Negative for fever and unexpected weight change.  HENT: Negative for congestion, dental problem, ear pain, nosebleeds, postnasal drip, rhinorrhea, sinus pressure, sneezing, sore throat and trouble swallowing.   Eyes: Negative for redness and itching.  Respiratory: Negative for cough, chest tightness, shortness of breath and wheezing.   Cardiovascular: Negative for palpitations and leg swelling.  Gastrointestinal: Negative for nausea and vomiting.       Indigestion  Genitourinary: Negative for dysuria.  Musculoskeletal: Positive for arthralgias. Negative for joint swelling.  Skin: Negative for rash.  Neurological: Negative for headaches.  Hematological: Does not bruise/bleed easily.  Psychiatric/Behavioral: Negative for dysphoric mood. The patient is nervous/anxious.        Objective:   Physical Exam        Assessment & Plan:

## 2016-10-31 NOTE — Patient Instructions (Signed)
Will arrange for home sleep study Will call to arrange for follow up after sleep study reviewed  

## 2016-11-20 DIAGNOSIS — G4733 Obstructive sleep apnea (adult) (pediatric): Secondary | ICD-10-CM | POA: Diagnosis not present

## 2016-11-22 ENCOUNTER — Telehealth: Payer: Self-pay | Admitting: Pulmonary Disease

## 2016-11-22 DIAGNOSIS — G4733 Obstructive sleep apnea (adult) (pediatric): Secondary | ICD-10-CM | POA: Insufficient documentation

## 2016-11-22 NOTE — Telephone Encounter (Signed)
HST 11/20/16 >> AHI 10.8, SaO2 low 87%   Will have my nurse inform pt that sleep study shows mild sleep apnea.  Options are 1) CPAP now, 2) ROV first.  If He is agreeable to CPAP, then please send order for auto CPAP range 5 to 15 cm H2O with heated humidity and mask of choice.  Have download sent 1 month after starting CPAP and set up ROV 2 months after starting CPAP.  ROV can be with me or NP.

## 2016-11-23 ENCOUNTER — Other Ambulatory Visit: Payer: Self-pay | Admitting: *Deleted

## 2016-11-23 DIAGNOSIS — R0683 Snoring: Secondary | ICD-10-CM

## 2016-11-23 DIAGNOSIS — G4733 Obstructive sleep apnea (adult) (pediatric): Secondary | ICD-10-CM | POA: Diagnosis not present

## 2016-11-23 NOTE — Telephone Encounter (Signed)
Spoke with patient-aware of results and would like to see VS first as it was mentioned about an oral appliance. VS how soon would you like to see this patient back? Thanks.

## 2016-11-28 NOTE — Telephone Encounter (Signed)
Scheduled patient with TP on 12/11/16 at 330. Patient is aware and verbalized understanding.

## 2016-11-28 NOTE — Telephone Encounter (Signed)
Can schedule appointment with NP.

## 2016-12-11 ENCOUNTER — Ambulatory Visit (INDEPENDENT_AMBULATORY_CARE_PROVIDER_SITE_OTHER): Payer: 59 | Admitting: Adult Health

## 2016-12-11 ENCOUNTER — Encounter: Payer: Self-pay | Admitting: Adult Health

## 2016-12-11 DIAGNOSIS — G4733 Obstructive sleep apnea (adult) (pediatric): Secondary | ICD-10-CM

## 2016-12-11 NOTE — Progress Notes (Signed)
@Patient  ID: Levi Berger, male    DOB: [birth year], 36 y.o.   MRN: [id number]  Chief Complaint  Patient presents with  . Follow-up    OSA     Referring provider: [name], *  HPI: 36 yo Male seen for sleep consult 10/31/2016 found to have mild sleep apnea  TEST HST 11/20/16 >> AHI 10.8, SaO2 low 87%  12/11/2016 Follow up : OSA  Patient presents for a follow-up for sleep apnea. Patient was seen for sleep consult 10/31/2016 for daytime sleepiness and snoring. Patient was set up for home sleep study that showed mild sleep apnea with AHI 10.8. SaO2 to low 87%. We discussed his diagnosis of sleep apnea with patient education. We went over treatment option including weight loss, oral appliance and C Pap. Patient does not feel that he would be able to wear CPAP. Would like to try oral appliance. Patient says that he is very active, exercises daily.  Very active. Cop on bike downtime .      No Known Allergies  Immunization History  Administered Date(s) Administered  . Tdap 10/30/2008    Past Medical History:  Diagnosis Date  . Generalized anxiety disorder 12/26/2015  . Palpitations     Tobacco History: History  Smoking Status  . Passive Smoke Exposure - Never Smoker  . Types: Pipe  Smokeless Tobacco  . Former Neurosurgeon    Comment: pt occ smokes a pipe maybe once or twice a month   Counseling given: Not Answered   Outpatient Encounter Prescriptions as of 12/11/2016  Medication Sig  . ibuprofen (ADVIL,MOTRIN) 800 MG tablet Take 1 tablet (800 mg total) by mouth every 8 (eight) hours as needed.  Marland Kitchen PARoxetine (PAXIL) 20 MG tablet Take 1 tablet (20 mg total) by mouth daily.   No facility-administered encounter medications on file as of 12/11/2016.      Review of Systems  Constitutional:   No  weight loss, night sweats,  Fevers, chills, fatigue, or  lassitude.  HEENT:   No headaches,  Difficulty swallowing,  Tooth/dental problems, or  Sore throat,       No sneezing, itching, ear ache, nasal congestion, post nasal drip,   CV:  No chest pain,  Orthopnea, PND, swelling in lower extremities, anasarca, dizziness, palpitations, syncope.   GI  No heartburn, indigestion, abdominal pain, nausea, vomiting, diarrhea, change in bowel habits, loss of appetite, bloody stools.   Resp: No shortness of breath with exertion or at rest.  No excess mucus, no productive cough,  No non-productive cough,  No coughing up of blood.  No change in color of mucus.  No wheezing.  No chest wall deformity  Skin: no rash or lesions.  GU: no dysuria, change in color of urine, no urgency or frequency.  No flank pain, no hematuria   MS:  No joint pain or swelling.  No decreased range of motion.  No back pain.    Physical Exam  BP 120/82 (BP Location: Left Arm, Cuff Size: Large)   Pulse 74   Ht 6\' 2"  (1.88 m)   Wt 286 lb 6.4 oz (129.9 kg)   SpO2 98%   BMI 36.77 kg/m   GEN: A/Ox3; pleasant , NAD, well nourished    HEENT:  [place]/AT,  EACs-clear, TMs-wnl, NOSE-clear, THROAT-clear, no lesions, no postnasal drip or exudate noted. Class 2 MP airway with elongated uvula   NECK:  Supple w/ fair ROM; no JVD; normal carotid impulses w/o bruits; no thyromegaly  or nodules palpated; no lymphadenopathy.    RESP  Clear  P & A; w/o, wheezes/ rales/ or rhonchi. no accessory muscle use, no dullness to percussion  CARD:  RRR, no m/r/g, no peripheral edema, pulses intact, no cyanosis or clubbing.  GI:   Soft & nt; nml bowel sounds; no organomegaly or masses detected.   Musco: Warm bil, no deformities or joint swelling noted.   Neuro: alert, no focal deficits noted.    Skin: Warm, no lesions or rashes    Lab Results:  CBC    Component Value Date/Time   WBC 6.5 03/27/2016 1358   RBC 4.85 03/27/2016 1358   HGB 15.1 03/27/2016 1358   HCT 44.5 03/27/2016 1358   PLT 215.0 03/27/2016 1358   MCV 91.7 03/27/2016 1358   MCH 31.2 09/22/2015 0148   MCHC 33.9 03/27/2016 1358    RDW 13.4 03/27/2016 1358   LYMPHSABS 1.5 03/27/2016 1358   MONOABS 0.5 03/27/2016 1358   EOSABS 0.1 03/27/2016 1358   BASOSABS 0.0 03/27/2016 1358    BMET    Component Value Date/Time   NA 141 03/27/2016 1358   K 3.8 03/27/2016 1358   CL 105 03/27/2016 1358   CO2 31 03/27/2016 1358   GLUCOSE 91 03/27/2016 1358   BUN 17 03/27/2016 1358   CREATININE 1.13 03/27/2016 1358   CALCIUM 9.0 03/27/2016 1358   GFRNONAA >60 09/22/2015 0148   GFRAA >60 09/22/2015 0148    BNP No results found for: BNP  ProBNP No results found for: PROBNP  Imaging: No results found.   Assessment & Plan:   OSA (obstructive sleep apnea) Mild OSA , pt education on OSA and treatment options  He would like to try oral appliance   Plan  Patient Instructions  Referral to Dr. Myrtis SerKatz for oral appliance Healthy sleep regimen as discussed Follow-up with Dr. Craige CottaSood  in 4 months and as needed       Rubye Oaksammy Mariska Daffin, NP 12/11/2016

## 2016-12-11 NOTE — Patient Instructions (Addendum)
Referral to Dr. Myrtis SerKatz for oral appliance Healthy sleep regimen as discussed Follow-up with Dr. Craige CottaSood  in 4 months and as needed

## 2016-12-11 NOTE — Assessment & Plan Note (Signed)
Mild OSA , pt education on OSA and treatment options  He would like to try oral appliance   Plan  Patient Instructions  Referral to Dr. Myrtis SerKatz for oral appliance Healthy sleep regimen as discussed Follow-up with Dr. Craige CottaSood  in 4 months and as needed

## 2016-12-12 NOTE — Progress Notes (Signed)
I have reviewed and agree with assessment/plan.  Korine Winton, MD [place] 12/12/2016, 10:01 AM Pager:  [phone]  

## 2016-12-12 NOTE — Addendum Note (Signed)
Addended by: Boone MasterJONES, Muhanad Torosyan E on: 12/12/2016 01:57 PM   Modules accepted: Orders

## 2017-03-30 IMAGING — MR MR HEAD W/O CM
7 of 8 series · 39 of 48 positions shown · non-contrast
Comparison: None.

CLINICAL DATA: Unexplained persistent illness, associated with
numbness and tingling on the RIGHT side, elevated blood pressure,
lightheadedness, brain fog and visual abnormalities since August 2015.

EXAM:
MRI HEAD WITHOUT CONTRAST
TECHNIQUE: Multiplanar, multiecho pulse sequences of the brain and surrounding
structures were obtained without intravenous contrast.

[Series 2: T1 · sagittal · 5.0mm · 0.45mm/px · 3 of 23 slices shown]
[im 1/23]
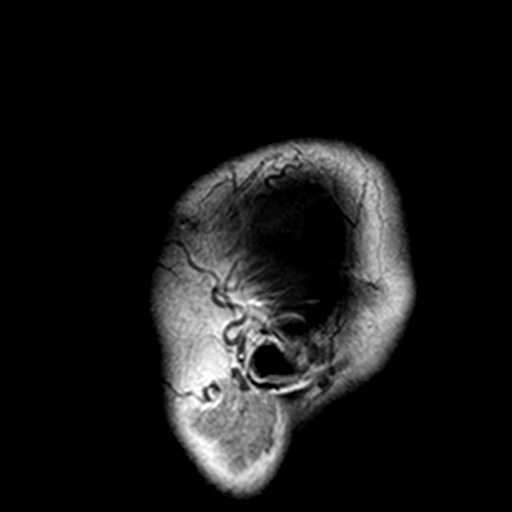
[im 12/23]
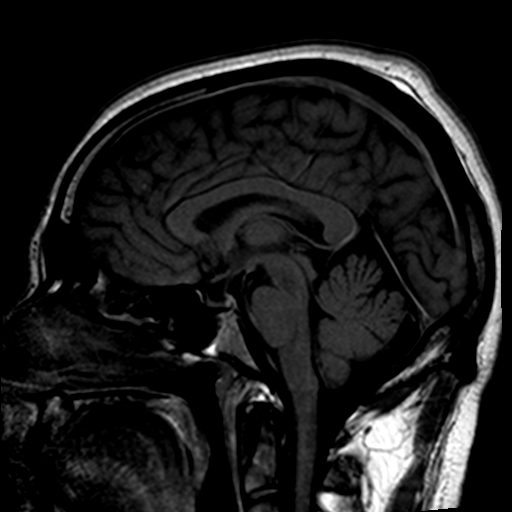
[im 23/23]
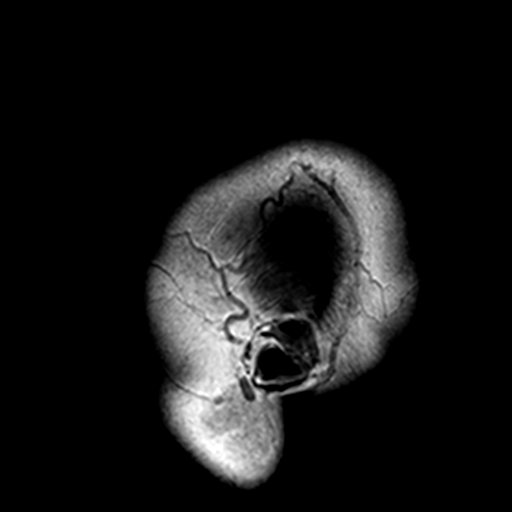

[Series 3: DWI · axial · 3.0mm · 2.19mm/px · z∈[-68,+77]mm · 13 of 90 slices shown (1 of 2)]
[im 1/90]
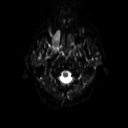
[im 8/90]
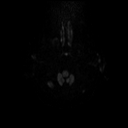
[im 15/90]
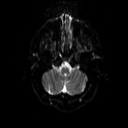
[im 23/90]
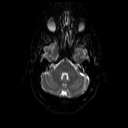
[im 30/90]
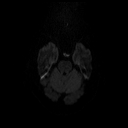
[im 38/90]
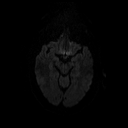
[im 45/90]
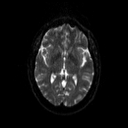
[im 52/90]
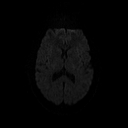
[im 60/90]
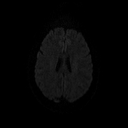
[im 67/90]
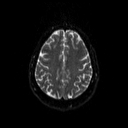
[im 75/90]
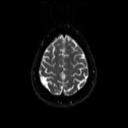
[im 82/90]
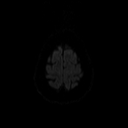
[im 90/90]
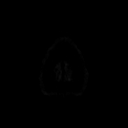

[Series 4: DWI · axial · 3.0mm · 2.19mm/px · z∈[-68,+77]mm · 7 of 45 slices shown (2 of 2)]
[im 1/45]
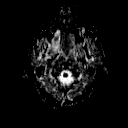
[im 8/45]
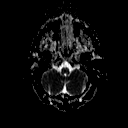
[im 15/45]
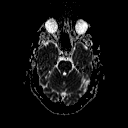
[im 23/45]
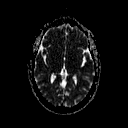
[im 30/45]
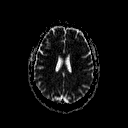
[im 37/45]
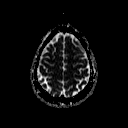
[im 45/45]
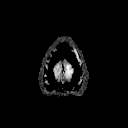

[Series 5: T2 · axial · 5.0mm · 0.45mm/px · z∈[-80,+88]mm · 4 of 25 slices shown (1 of 3)]
[im 1/25]
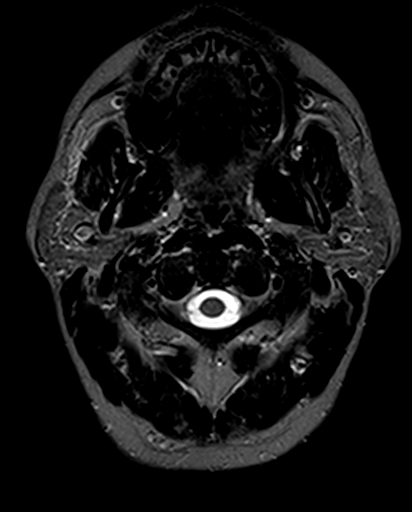
[im 9/25]
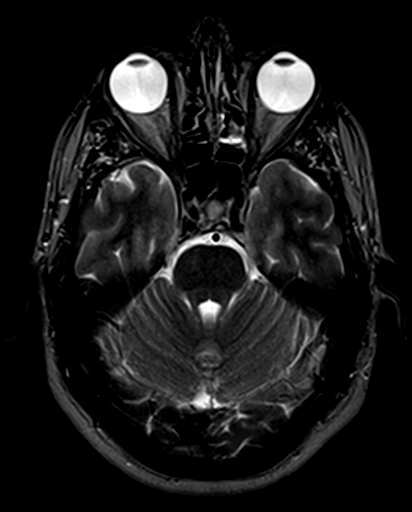
[im 17/25]
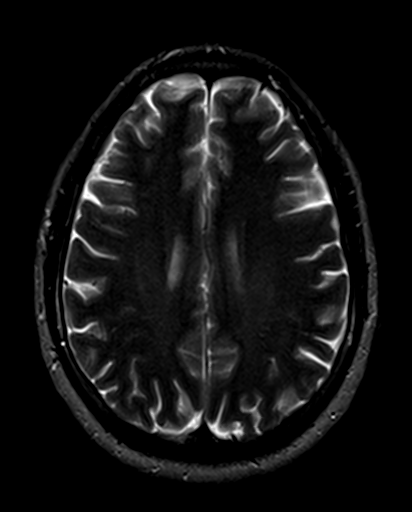
[im 25/25]
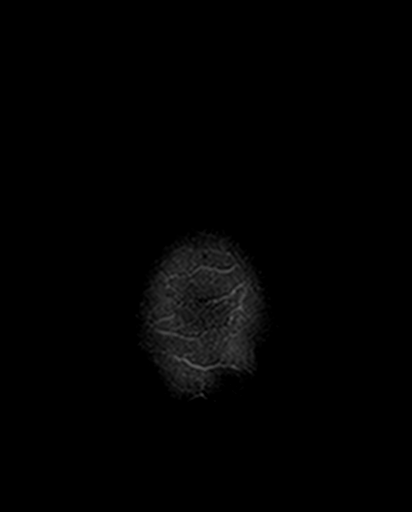

[Series 6: T2 · axial · 5.0mm · 0.45mm/px · z∈[-80,+88]mm · 4 of 25 slices shown (2 of 3)]
[im 1/25]
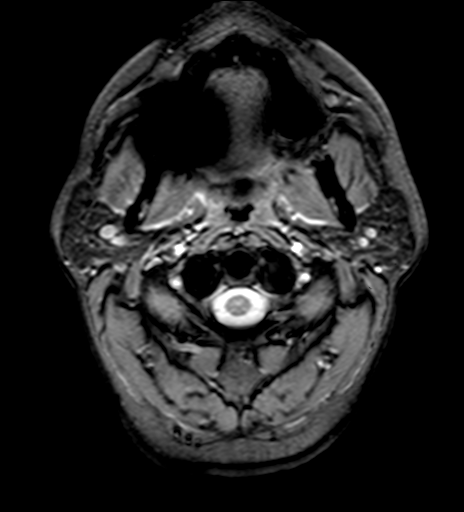
[im 9/25]
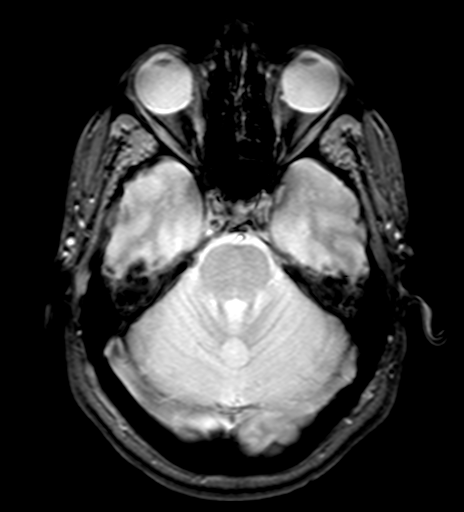
[im 17/25]
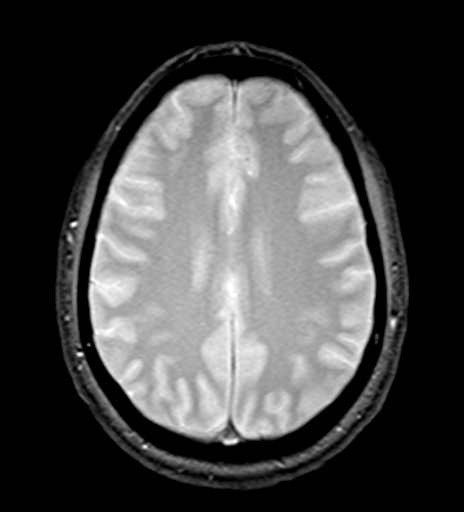
[im 25/25]
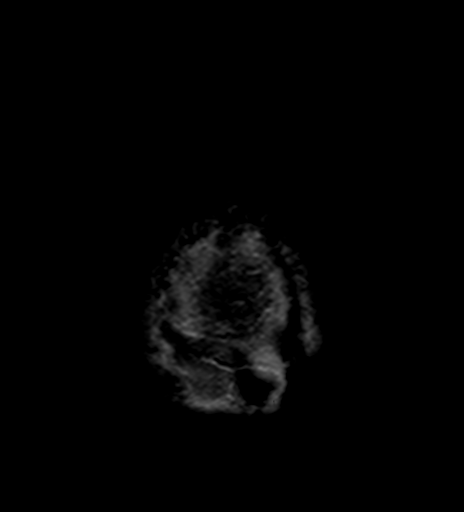

[Series 7: FLAIR · axial · 5.0mm · 0.45mm/px · z∈[-80,+88]mm · 4 of 25 slices shown]
[im 1/25]
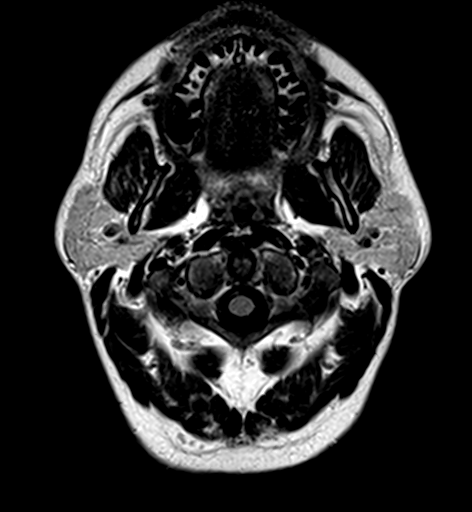
[im 9/25]
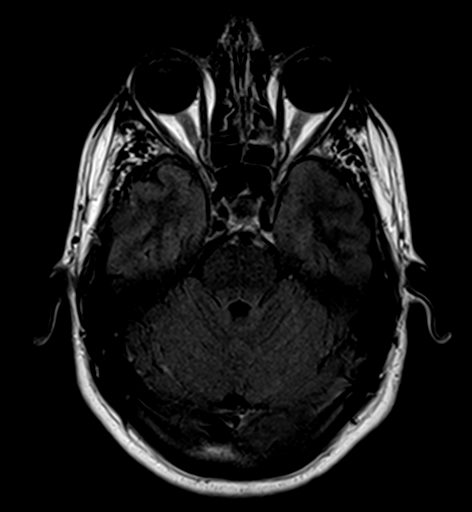
[im 17/25]
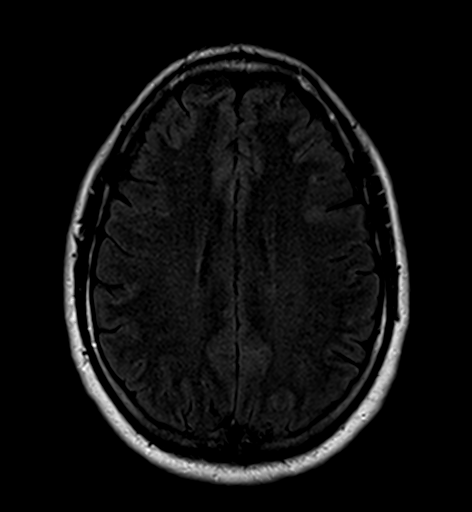
[im 25/25]
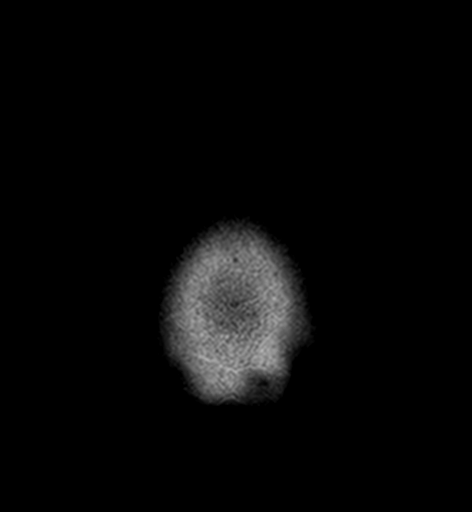

[Series 9: T2 · coronal · 5.0mm · 0.45mm/px · 4 of 30 slices shown (3 of 3)]
[im 1/30]
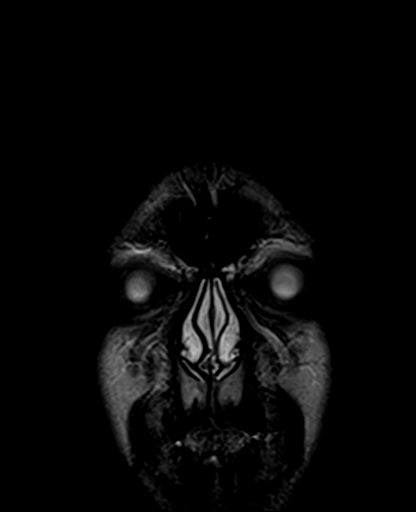
[im 10/30]
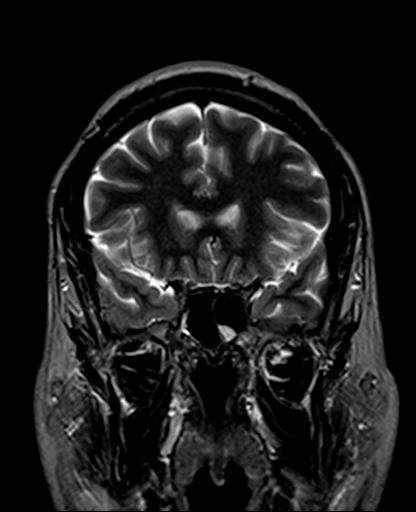
[im 20/30]
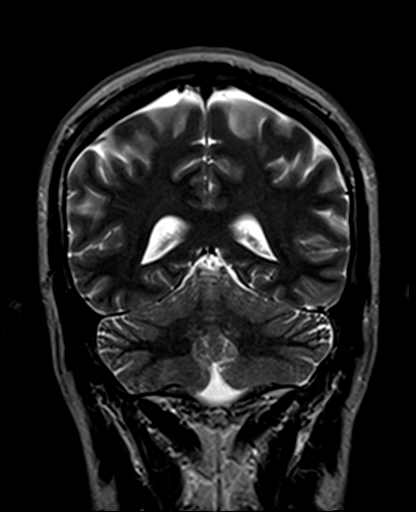
[im 30/30]
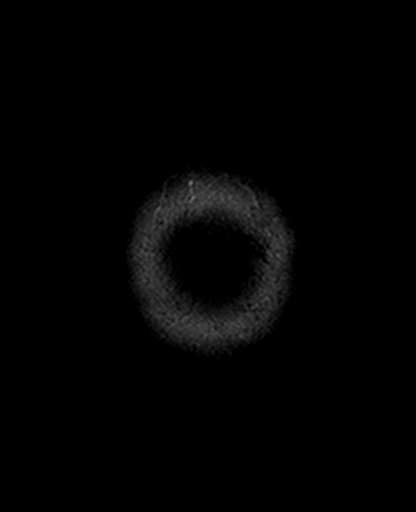

[39 of 48 positions shown; findings below may reference images not displayed]

FINDINGS: No evidence for acute infarction, hemorrhage, mass lesion,
hydrocephalus, or extra-axial fluid. Normal cerebral volume. No
white matter disease.

Pituitary, pineal, and cerebellar tonsils unremarkable. No upper
cervical lesions.

Flow voids are maintained throughout the carotid, basilar, and
vertebral arteries. There are no areas of chronic hemorrhage.

Visualized calvarium, skull base, and upper cervical osseous
structures unremarkable. Scalp and extracranial soft tissues,
orbits, sinuses, and mastoids show no acute process. Maxillary and
sphenoid sinus retention cysts, non worrisome.
IMPRESSION: Negative exam.  No cause seen for reported symptoms.

## 2017-04-12 ENCOUNTER — Ambulatory Visit (INDEPENDENT_AMBULATORY_CARE_PROVIDER_SITE_OTHER): Payer: 59 | Admitting: Pulmonary Disease

## 2017-04-12 ENCOUNTER — Encounter: Payer: Self-pay | Admitting: Pulmonary Disease

## 2017-04-12 VITALS — BP 120/78 | HR 93 | Ht 74.0 in | Wt 287.4 lb

## 2017-04-12 DIAGNOSIS — G4733 Obstructive sleep apnea (adult) (pediatric): Secondary | ICD-10-CM

## 2017-04-12 NOTE — Patient Instructions (Signed)
Will arrange for CPAP set up  Follow up in 2 months after getting CPAP 

## 2017-04-12 NOTE — Progress Notes (Signed)
Current Outpatient Prescriptions on File Prior to Visit  Medication Sig  . ibuprofen (ADVIL,MOTRIN) 800 MG tablet Take 1 tablet (800 mg total) by mouth every 8 (eight) hours as needed.  Marland Kitchen PARoxetine (PAXIL) 20 MG tablet Take 1 tablet (20 mg total) by mouth daily.   No current facility-administered medications on file prior to visit.      Chief Complaint  Patient presents with  . Follow-up    Pt is doing well overall. Pt states that insurance is not covering the oral compliance; pt would like to discuss options for CPAP machine today.     Sleep tests HST 11/20/16 >> AHI 10.8, SaO2 low 87%  Cardiac tests Echo 02/15/12 >> EF 55 to 60%, grade 2 DD  Past medical history Anxiety  Past surgical history, Family history, Social history, Allergies all reviewed.  Vital Signs BP 120/78 (BP Location: Left Arm, Cuff Size: Normal)   Pulse 93   Ht  (1.88 m)   Wt 287 lb 6.4 oz (130.4 kg)   SpO2 96%   BMI 36.90 kg/m   History of Present Illness Levi Berger is a 36 y.o. male with obstructive sleep apnea.  Since I last saw him he had a home sleep study.  This showed mild sleep apnea.  He was referred to Dr. Myrtis Ser for oral appliance.  Wasn't covered by insurance and too expense to pay out of pocket.  He is on special assignment and working day shift.  Once this is done he will go back to rotating shift.   Physical Exam  General - pleasant Eyes - pupils reactive ENT - no sinus tenderness, no oral exudate, no LAN, MP 2, low laying soft palate Cardiac - regular, no murmur Chest - no wheeze, rales Abd - soft, non tender Ext - no edema Skin - no rashes Neuro - normal strength Psych - normal mood   Assessment/Plan  Obstructive sleep apnea. - he has mild sleep apnea associated with history of anxiety and depression - wasn't able to get oral appliance set up - will arrange for auto CPAP set up - discussed techniques to acclimatize to using CPAP   Patient Instructions   Will arrange for CPAP set up  Follow up in 2 months after getting CPAP    Coralyn Helling, MD  Pulmonary/Critical Care/Sleep Pager:  [phone] 04/12/2017, 1:49 PM

## 2017-06-11 ENCOUNTER — Ambulatory Visit: Payer: Self-pay | Admitting: Family Medicine

## 2017-06-13 ENCOUNTER — Ambulatory Visit: Payer: Self-pay | Admitting: Family Medicine

## 2017-06-21 ENCOUNTER — Ambulatory Visit: Payer: 59 | Admitting: Family Medicine

## 2017-06-21 ENCOUNTER — Encounter: Payer: Self-pay | Admitting: Family Medicine

## 2017-06-21 ENCOUNTER — Other Ambulatory Visit: Payer: Self-pay

## 2017-06-21 DIAGNOSIS — R002 Palpitations: Secondary | ICD-10-CM | POA: Diagnosis not present

## 2017-06-21 MED ORDER — PAROXETINE HCL 20 MG PO TABS: 20.0000 mg | ORAL_TABLET | Freq: Every day | ORAL | 3 refills | Status: DC

## 2017-06-21 MED ORDER — PAROXETINE HCL 20 MG PO TABS: 20.0000 mg | ORAL_TABLET | Freq: Every day | ORAL | 8 refills | Status: DC

## 2017-06-21 NOTE — Progress Notes (Signed)
Subjective:  I acted as a Neurosurgeon for [employer], CMA   Patient ID: Levi Berger, male    DOB: [birth year], 36 y.o.   MRN: [id number]  Chief Complaint  Patient presents with  . Medication Refill    HPI  Patient is in today for medication refills on paxil  Patient Care Team: Zola Button, Grayling Congress, DO as PCP - General (Family Medicine)   Past Medical History:  Diagnosis Date  . Generalized anxiety disorder 12/26/2015  . Palpitations     Past Surgical History:  Procedure Laterality Date  . None    . TRANSTHORACIC ECHOCARDIOGRAM  01/2012   Normal LV size and thickness. Normal wall motion with EF of 55-60%. Possible grade 2 diastolic dysfunction. Otherwise normal valve function.    Family History  Problem Relation Age of Onset  . Lung cancer Father        Smoker  . Hypertension Father   . Diabetes Father   . Arthritis Paternal Grandfather   . Colon cancer Paternal Grandfather   . Diabetes Paternal Grandfather   . Heart disease Maternal Grandfather   . Alcohol abuse Maternal Grandfather     Social History   Socioeconomic History  . Marital status: Single    Spouse name: Not on file  . Number of children: 0  . Years of education: Not on file  . Highest education level: Not on file  Social Needs  . Financial resource strain: Not on file  . Food insecurity - worry: Not on file  . Food insecurity - inability: Not on file  . Transportation needs - medical: Not on file  . Transportation needs - non-medical: Not on file  Occupational History  . Occupation: [employer]    Employer: UNEMPLOYED  Tobacco Use  . Smoking status: Passive Smoke Exposure - Never Smoker  . Smokeless tobacco: Former Neurosurgeon  . Tobacco comment: pt occ smokes a pipe maybe once or twice a month  Substance and Sexual Activity  . Alcohol use: Yes    Alcohol/week: 2.5 oz    Types: 5 Standard drinks or equivalent per week    Comment: ocassionally  . Drug use: No  . Sexual activity:  Not on file  Other Topics Concern  . Not on file  Social History Narrative  . Not on file    Outpatient Medications Prior to Visit  Medication Sig Dispense Refill  . ibuprofen (ADVIL,MOTRIN) 800 MG tablet Take 1 tablet (800 mg total) by mouth every 8 (eight) hours as needed. 30 tablet 3  . PARoxetine (PAXIL) 20 MG tablet Take 1 tablet (20 mg total) by mouth daily. 30 tablet 8  . PARoxetine (PAXIL) 20 MG tablet Take 1 tablet (20 mg total) by mouth daily. 30 tablet 8   No facility-administered medications prior to visit.     No Known Allergies  Review of Systems  Constitutional: Negative for chills, fever and malaise/fatigue.  HENT: Negative for congestion and hearing loss.   Eyes: Negative for discharge.  Respiratory: Negative for cough, sputum production and shortness of breath.   Cardiovascular: Negative for chest pain, palpitations and leg swelling.  Gastrointestinal: Negative for abdominal pain, blood in stool, constipation, diarrhea, heartburn, nausea and vomiting.  Genitourinary: Negative for dysuria, frequency, hematuria and urgency.  Musculoskeletal: Negative for back pain, falls and myalgias.  Skin: Negative for rash.  Neurological: Negative for dizziness, sensory change, loss of consciousness, weakness and headaches.  Endo/Heme/Allergies: Negative for environmental allergies. Does not bruise/bleed  easily.  Psychiatric/Behavioral: Negative for depression and suicidal ideas. The patient is not nervous/anxious and does not have insomnia.        Objective:    Physical Exam  Constitutional: He is oriented to person, place, and time. Vital signs are normal. He appears well-developed and well-nourished. He is sleeping.  HENT:  Head: Normocephalic and atraumatic.  Mouth/Throat: Oropharynx is clear and moist.  Eyes: EOM are normal. Pupils are equal, round, and reactive to light.  Neck: Normal range of motion. Neck supple. No thyromegaly present.  Cardiovascular: Normal rate  and regular rhythm.  No murmur heard. Pulmonary/Chest: Effort normal and breath sounds normal. No respiratory distress. He has no wheezes. He has no rales. He exhibits no tenderness.  Musculoskeletal: He exhibits no edema or tenderness.  Neurological: He is alert and oriented to person, place, and time.  Skin: Skin is warm and dry.  Psychiatric: He has a normal mood and affect. His behavior is normal. Judgment and thought content normal.  Nursing note and vitals reviewed.   BP 130/90   Pulse 71   Temp 98.3 F (36.8 C) (Oral)   Ht 6\' 2"  (1.88 m)   Wt 295 lb (133.8 kg)   SpO2 95%   BMI 37.88 kg/m  Wt Readings from Last 3 Encounters:  06/21/17 295 lb (133.8 kg)  04/12/17 287 lb 6.4 oz (130.4 kg)  12/11/16 286 lb 6.4 oz (129.9 kg)   BP Readings from Last 3 Encounters:  06/21/17 130/90  04/12/17 120/78  12/11/16 120/82     Immunization History  Administered Date(s) Administered  . Tdap 10/30/2008    Health Maintenance  Topic Date Due  . HIV Screening  09/09/1993  . INFLUENZA VACCINE  09/29/2017 (Originally 01/30/2017)  . TETANUS/TDAP  10/31/2018    Lab Results  Component Value Date   WBC 6.5 03/27/2016   HGB 15.1 03/27/2016   HCT 44.5 03/27/2016   PLT 215.0 03/27/2016   GLUCOSE 91 03/27/2016   CHOL 140 09/29/2015   TRIG 73.0 09/29/2015   HDL 42.90 09/29/2015   LDLCALC 83 09/29/2015   ALT 29 03/27/2016   AST 15 03/27/2016   NA 141 03/27/2016   K 3.8 03/27/2016   CL 105 03/27/2016   CREATININE 1.13 03/27/2016   BUN 17 03/27/2016   CO2 31 03/27/2016   TSH 1.71 03/27/2016    Lab Results  Component Value Date   TSH 1.71 03/27/2016   Lab Results  Component Value Date   WBC 6.5 03/27/2016   HGB 15.1 03/27/2016   HCT 44.5 03/27/2016   MCV 91.7 03/27/2016   PLT 215.0 03/27/2016   Lab Results  Component Value Date   NA 141 03/27/2016   K 3.8 03/27/2016   CO2 31 03/27/2016   GLUCOSE 91 03/27/2016   BUN 17 03/27/2016   CREATININE 1.13 03/27/2016    BILITOT 0.3 03/27/2016   ALKPHOS 69 03/27/2016   AST 15 03/27/2016   ALT 29 03/27/2016   PROT 6.8 03/27/2016   ALBUMIN 4.0 03/27/2016   CALCIUM 9.0 03/27/2016   ANIONGAP 11 09/22/2015   GFR 77.38 03/27/2016   Lab Results  Component Value Date   CHOL 140 09/29/2015   Lab Results  Component Value Date   HDL 42.90 09/29/2015   Lab Results  Component Value Date   LDLCALC 83 09/29/2015   Lab Results  Component Value Date   TRIG 73.0 09/29/2015   Lab Results  Component Value Date   CHOLHDL 3 09/29/2015  No results found for: HGBA1C       Assessment & Plan:   Problem List Items Addressed This Visit      Unprioritized   Palpitations   Relevant Medications   PARoxetine (PAXIL) 20 MG tablet      I am having Kevin FentonAlfred Chamorro Berger maintain his ibuprofen and PARoxetine.  Meds ordered this encounter  Medications  . PARoxetine (PAXIL) 20 MG tablet    Sig: Take 1 tablet (20 mg total) by mouth daily.    Dispense:  90 tablet    Refill:  3    CMA served as scribe during this visit. History, Physical and Plan performed by medical provider. Documentation and orders reviewed and attested to.  Donato SchultzYvonne R Lowne Chase, DO

## 2017-06-21 NOTE — Patient Instructions (Signed)

## 2017-08-10 ENCOUNTER — Telehealth: Payer: Self-pay | Admitting: Pulmonary Disease

## 2017-08-10 NOTE — Telephone Encounter (Signed)
Auto CPAP 04/29/17 to 05/28/17 >> used on 24 of 30 nights with average 7 hrs 17 min.  Average AHI 1.4 with median CPAP 6 and 95 th percentile CPAP 8 cm H2O.   Please let him know that he is overdue for follow up of sleep apnea.  Please schedule ROV with me or NP.

## 2017-08-12 NOTE — Telephone Encounter (Signed)
Spoke with pt. He is aware of his CPAP download results. Pt has been scheduled to see VS on 08/28/2017 at 11:15am. Nothing further was needed.

## 2017-08-21 DIAGNOSIS — H5213 Myopia, bilateral: Secondary | ICD-10-CM | POA: Diagnosis not present

## 2017-08-28 ENCOUNTER — Ambulatory Visit: Payer: 59 | Admitting: Pulmonary Disease

## 2017-08-28 ENCOUNTER — Encounter: Payer: Self-pay | Admitting: Pulmonary Disease

## 2017-08-28 VITALS — BP 112/70 | HR 89 | Ht 74.0 in | Wt 291.0 lb

## 2017-08-28 DIAGNOSIS — G4733 Obstructive sleep apnea (adult) (pediatric): Secondary | ICD-10-CM | POA: Diagnosis not present

## 2017-08-28 DIAGNOSIS — Z9989 Dependence on other enabling machines and devices: Secondary | ICD-10-CM

## 2017-08-28 NOTE — Patient Instructions (Signed)
Will have [facility] get you proper fitting mask  Can look up CPAP supplies and mask options at CPAP.com or similar website  Follow up in 1 year

## 2017-08-28 NOTE — Progress Notes (Signed)
[facility]  Chief Complaint  Patient presents with  . Follow-up    Pt needs new mask fitting, it is not holding seal.    Vital signs: BP 112/70 (BP Location: Left Arm, Cuff Size: Normal)   Pulse 89   Ht 6\' 2"  (1.88 m)   Wt 291 lb (132 kg)   SpO2 99%   BMI 37.36 kg/m   History of Present Illness: Levi Berger is a 37 y.o. male with obstructive sleep apnea.  He has been doing well with CPAP.  He uses nasal pillow mask.  He got a size too small for mask replacement.  Prior to this his mask fit well.  He is feeling more alert during the day.  He works days for 3 days per week.  He does two weeks of this.  He then works nights for 3 days per week, and does this for 2 weeks.  He gets several days break between switching from day to night schedule.   Physical Exam:  General - pleasant Eyes - pupils reactive ENT - no sinus tenderness, no oral exudate, no LAN Cardiac - regular, no murmur Chest - no wheeze, rales Abd - soft, non tender Ext - no edema Skin - no rashes Neuro - normal strength Psych - normal mood   Assessment/Plan:  Obstructive sleep apnea. - he is compliant with CPAP and reports benefit from therapy - will have [facility] get him proper fitting mask - continue auto CPAP   Patient Instructions  Will have [facility] get you proper fitting mask  Can look up CPAP supplies and mask options at CPAP.com or similar website  Follow up in 1 year    Coralyn HellingVineet Tashawn Laswell, MD [facility] 08/28/2017, 11:36 AM Pager:  [phone]  Flow Sheet  Sleep tests: HST 11/20/16 >> AHI 10.8, SaO2 low 87% Auto CPAP 07/29/17 to 08/27/17 >> used on 29 of 30 nights with average 6 hrs 55 min.  Average AHI 2.3 with median CPAP 7 and 95 th percentile CPAP 10 cm H2O  Cardiac tests Echo 02/15/12 >> EF 55 to 60%, grade 2 DD  Past Medical History: He  has a past medical history of Generalized anxiety disorder (12/26/2015) and  Palpitations.  Past Surgical History: He  has a past surgical history that includes None and transthoracic echocardiogram (01/2012).  Family History: His family history includes Alcohol abuse in his maternal grandfather; Arthritis in his paternal grandfather; Colon cancer in his paternal grandfather; Diabetes in his father and paternal grandfather; Heart disease in his maternal grandfather; Hypertension in his father; Lung cancer in his father.  Social History: He  reports that he is a non-smoker but has been exposed to tobacco smoke. He has quit using smokeless tobacco. He reports that he drinks about 2.5 oz of alcohol per week. He reports that he does not use drugs.  Medications: Allergies as of 08/28/2017   No Known Allergies     Medication List        Accurate as of 08/28/17 11:36 AM. Always use your most recent med list.          ibuprofen 800 MG tablet Commonly known as:  ADVIL,MOTRIN Take 1 tablet (800 mg total) by mouth every 8 (eight) hours as needed.   PARoxetine 20 MG tablet Commonly known as:  PAXIL Take 1 tablet (20 mg total) by mouth daily.

## 2017-08-29 DIAGNOSIS — G4733 Obstructive sleep apnea (adult) (pediatric): Secondary | ICD-10-CM | POA: Diagnosis not present

## 2017-09-27 DIAGNOSIS — G4733 Obstructive sleep apnea (adult) (pediatric): Secondary | ICD-10-CM | POA: Diagnosis not present

## 2017-10-07 ENCOUNTER — Encounter: Payer: Self-pay | Admitting: Family Medicine

## 2017-10-07 ENCOUNTER — Ambulatory Visit: Payer: 59 | Admitting: Family Medicine

## 2017-10-07 VITALS — BP 136/86 | HR 72 | Temp 98.1°F | Resp 16 | Ht 74.0 in | Wt 284.0 lb

## 2017-10-07 DIAGNOSIS — L03316 Cellulitis of umbilicus: Secondary | ICD-10-CM | POA: Diagnosis not present

## 2017-10-07 MED ORDER — DOXYCYCLINE HYCLATE 100 MG PO TABS: 100.0000 mg | ORAL_TABLET | Freq: Two times a day (BID) | ORAL | 0 refills | Status: DC

## 2017-10-07 NOTE — Progress Notes (Signed)
Patient ID: Levi Berger, male   DOB: 1980/10/27, 37 y.o.   MRN: [id number]    Subjective:  I acted as a Neurosurgeon for Dr. Zola Button.  Apolonio Schneiders, CMA   Patient ID: Levi Berger, male    DOB: 1981/04/24, 37 y.o.   MRN: [id number]  Chief Complaint  Patient presents with  . navel inflammation    HPI  Patient is in today for navel inflammation.  It started about a month ago.  Has used only soap and water.    Patient Care Team: Zola Button, Grayling Congress, DO as PCP - General (Family Medicine)   Past Medical History:  Diagnosis Date  . Generalized anxiety disorder 12/26/2015  . Palpitations     Past Surgical History:  Procedure Laterality Date  . None    . TRANSTHORACIC ECHOCARDIOGRAM  01/2012   Normal LV size and thickness. Normal wall motion with EF of 55-60%. Possible grade 2 diastolic dysfunction. Otherwise normal valve function.    Family History  Problem Relation Age of Onset  . Lung cancer Father        Smoker  . Hypertension Father   . Diabetes Father   . Arthritis Paternal Grandfather   . Colon cancer Paternal Grandfather   . Diabetes Paternal Grandfather   . Heart disease Maternal Grandfather   . Alcohol abuse Maternal Grandfather     Social History   Socioeconomic History  . Marital status: Single    Spouse name: Not on file  . Number of children: 0  . Years of education: Not on file  . Highest education level: Not on file  Occupational History  . Occupation: [employer]    Employer: UNEMPLOYED  Social Needs  . Financial resource strain: Not on file  . Food insecurity:    Worry: Not on file    Inability: Not on file  . Transportation needs:    Medical: Not on file    Non-medical: Not on file  Tobacco Use  . Smoking status: Passive Smoke Exposure - Never Smoker  . Smokeless tobacco: Former Neurosurgeon  . Tobacco comment: pt occ smokes a pipe maybe once or twice a month  Substance and Sexual Activity  . Alcohol use: Yes    Alcohol/week: 2.5 oz   Types: 5 Standard drinks or equivalent per week    Comment: ocassionally  . Drug use: No  . Sexual activity: Not on file  Lifestyle  . Physical activity:    Days per week: Not on file    Minutes per session: Not on file  . Stress: Not on file  Relationships  . Social connections:    Talks on phone: Not on file    Gets together: Not on file    Attends religious service: Not on file    Active member of club or organization: Not on file    Attends meetings of clubs or organizations: Not on file    Relationship status: Not on file  . Intimate partner violence:    Fear of current or ex partner: Not on file    Emotionally abused: Not on file    Physically abused: Not on file    Forced sexual activity: Not on file  Other Topics Concern  . Not on file  Social History Narrative  . Not on file    Outpatient Medications Prior to Visit  Medication Sig Dispense Refill  . ibuprofen (ADVIL,MOTRIN) 800 MG tablet Take 1 tablet (800 mg total) by mouth every 8 (eight)  hours as needed. 30 tablet 3  . PARoxetine (PAXIL) 20 MG tablet Take 1 tablet (20 mg total) by mouth daily. 90 tablet 3   No facility-administered medications prior to visit.     No Known Allergies  Review of Systems  Constitutional: Negative for fever and malaise/fatigue.  HENT: Negative for congestion.   Eyes: Negative for blurred vision.  Respiratory: Negative for cough and shortness of breath.   Cardiovascular: Negative for chest pain, palpitations and leg swelling.  Gastrointestinal: Negative for vomiting.  Musculoskeletal: Negative for back pain.  Skin: Positive for itching (navel). Negative for rash.       Irritation around navel  Neurological: Negative for loss of consciousness and headaches.       Objective:    Physical Exam  Abdominal:      BP 136/86 (BP Location: Right Arm, Cuff Size: Large)   Pulse 72   Temp 98.1 F (36.7 C) (Oral)   Resp 16   Ht 6\' 2"  (1.88 m)   Wt 284 lb (128.8 kg)   SpO2  97%   BMI 36.46 kg/m  Wt Readings from Last 3 Encounters:  10/07/17 284 lb (128.8 kg)  08/28/17 291 lb (132 kg)  06/21/17 295 lb (133.8 kg)   BP Readings from Last 3 Encounters:  10/07/17 136/86  08/28/17 112/70  06/21/17 130/90     Immunization History  Administered Date(s) Administered  . Tdap 10/30/2008    Health Maintenance  Topic Date Due  . HIV Screening  09/09/1993  . INFLUENZA VACCINE  01/30/2018  . TETANUS/TDAP  10/31/2018    Lab Results  Component Value Date   WBC 6.5 03/27/2016   HGB 15.1 03/27/2016   HCT 44.5 03/27/2016   PLT 215.0 03/27/2016   GLUCOSE 91 03/27/2016   CHOL 140 09/29/2015   TRIG 73.0 09/29/2015   HDL 42.90 09/29/2015   LDLCALC 83 09/29/2015   ALT 29 03/27/2016   AST 15 03/27/2016   NA 141 03/27/2016   K 3.8 03/27/2016   CL 105 03/27/2016   CREATININE 1.13 03/27/2016   BUN 17 03/27/2016   CO2 31 03/27/2016   TSH 1.71 03/27/2016    Lab Results  Component Value Date   TSH 1.71 03/27/2016   Lab Results  Component Value Date   WBC 6.5 03/27/2016   HGB 15.1 03/27/2016   HCT 44.5 03/27/2016   MCV 91.7 03/27/2016   PLT 215.0 03/27/2016   Lab Results  Component Value Date   NA 141 03/27/2016   K 3.8 03/27/2016   CO2 31 03/27/2016   GLUCOSE 91 03/27/2016   BUN 17 03/27/2016   CREATININE 1.13 03/27/2016   BILITOT 0.3 03/27/2016   ALKPHOS 69 03/27/2016   AST 15 03/27/2016   ALT 29 03/27/2016   PROT 6.8 03/27/2016   ALBUMIN 4.0 03/27/2016   CALCIUM 9.0 03/27/2016   ANIONGAP 11 09/22/2015   GFR 77.38 03/27/2016   Lab Results  Component Value Date   CHOL 140 09/29/2015   Lab Results  Component Value Date   HDL 42.90 09/29/2015   Lab Results  Component Value Date   LDLCALC 83 09/29/2015   Lab Results  Component Value Date   TRIG 73.0 09/29/2015   Lab Results  Component Value Date   CHOLHDL 3 09/29/2015   No results found for: HGBA1C       Assessment & Plan:   Problem List Items Addressed This Visit     None    Visit Diagnoses  Navel cellulitis    -  Primary   Relevant Medications   doxycycline (VIBRA-TABS) 100 MG tablet   Other Relevant Orders   Wound culture      I am having Levi Berger "Al" start on doxycycline. I am also having him maintain his ibuprofen and PARoxetine.  Meds ordered this encounter  Medications  . doxycycline (VIBRA-TABS) 100 MG tablet    Sig: Take 1 tablet (100 mg total) by mouth 2 (two) times daily.    Dispense:  20 tablet    Refill:  0    CMA served as scribe during this visit. History, Physical and Plan performed by medical provider. Documentation and orders reviewed and attested to.  [name], DO

## 2017-10-07 NOTE — Patient Instructions (Signed)

## 2017-10-10 LAB — WOUND CULTURE
MICRO NUMBER: 90429646
SPECIMEN QUALITY: ADEQUATE

## 2017-10-28 DIAGNOSIS — G4733 Obstructive sleep apnea (adult) (pediatric): Secondary | ICD-10-CM | POA: Diagnosis not present

## 2017-11-27 DIAGNOSIS — G4733 Obstructive sleep apnea (adult) (pediatric): Secondary | ICD-10-CM | POA: Diagnosis not present

## 2017-12-28 DIAGNOSIS — G4733 Obstructive sleep apnea (adult) (pediatric): Secondary | ICD-10-CM | POA: Diagnosis not present

## 2018-01-27 DIAGNOSIS — G4733 Obstructive sleep apnea (adult) (pediatric): Secondary | ICD-10-CM | POA: Diagnosis not present

## 2018-03-28 DIAGNOSIS — G4733 Obstructive sleep apnea (adult) (pediatric): Secondary | ICD-10-CM | POA: Diagnosis not present

## 2018-06-17 ENCOUNTER — Encounter: Payer: Self-pay | Admitting: Family Medicine

## 2018-06-17 DIAGNOSIS — R002 Palpitations: Secondary | ICD-10-CM

## 2018-06-17 MED ORDER — PAROXETINE HCL 20 MG PO TABS: 20.0000 mg | ORAL_TABLET | Freq: Every day | ORAL | 0 refills | Status: DC

## 2018-09-05 ENCOUNTER — Ambulatory Visit (INDEPENDENT_AMBULATORY_CARE_PROVIDER_SITE_OTHER): Payer: 59 | Admitting: Family Medicine

## 2018-09-05 ENCOUNTER — Encounter: Payer: Self-pay | Admitting: Family Medicine

## 2018-09-05 VITALS — BP 136/88 | HR 92 | Temp 98.6°F | Resp 12 | Ht 73.5 in | Wt 267.0 lb

## 2018-09-05 DIAGNOSIS — R002 Palpitations: Secondary | ICD-10-CM | POA: Diagnosis not present

## 2018-09-05 DIAGNOSIS — F411 Generalized anxiety disorder: Secondary | ICD-10-CM

## 2018-09-05 DIAGNOSIS — Z0000 Encounter for general adult medical examination without abnormal findings: Secondary | ICD-10-CM

## 2018-09-05 MED ORDER — PAROXETINE HCL 20 MG PO TABS: 20.0000 mg | ORAL_TABLET | Freq: Every day | ORAL | 0 refills | Status: DC

## 2018-09-05 NOTE — Patient Instructions (Signed)
Preventive Care 18-39 Years, Male Preventive care refers to lifestyle choices and visits with your health care provider that can promote health and wellness. What does preventive care include?   A yearly physical exam. This is also called an annual well check.  Dental exams once or twice a year.  Routine eye exams. Ask your health care provider how often you should have your eyes checked.  Personal lifestyle choices, including: ? Daily care of your teeth and gums. ? Regular physical activity. ? Eating a healthy diet. ? Avoiding tobacco and drug use. ? Limiting alcohol use. ? Practicing safe sex. What happens during an annual well check? The services and screenings done by your health care provider during your annual well check will depend on your age, overall health, lifestyle risk factors, and family history of disease. Counseling Your health care provider may ask you questions about your:  Alcohol use.  Tobacco use.  Drug use.  Emotional well-being.  Home and relationship well-being.  Sexual activity.  Eating habits.  Work and work environment. Screening You may have the following tests or measurements:  Height, weight, and BMI.  Blood pressure.  Lipid and cholesterol levels. These may be checked every 5 years starting at age 20.  Diabetes screening. This is done by checking your blood sugar (glucose) after you have not eaten for a while (fasting).  Skin check.  Hepatitis C blood test.  Hepatitis B blood test.  Sexually transmitted disease (STD) testing. Discuss your test results, treatment options, and if necessary, the need for more tests with your health care provider. Vaccines Your health care provider may recommend certain vaccines, such as:  Influenza vaccine. This is recommended every year.  Tetanus, diphtheria, and acellular pertussis (Tdap, Td) vaccine. You may need a Td booster every 10 years.  Varicella vaccine. You may need this if you  have not been vaccinated.  HPV vaccine. If you are 26 or younger, you may need three doses over 6 months.  Measles, mumps, and rubella (MMR) vaccine. You may need at least one dose of MMR.You may also need a second dose.  Pneumococcal 13-valent conjugate (PCV13) vaccine. You may need this if you have certain conditions and have not been vaccinated.  Pneumococcal polysaccharide (PPSV23) vaccine. You may need one or two doses if you smoke cigarettes or if you have certain conditions.  Meningococcal vaccine. One dose is recommended if you are age 19-21 years and a first-year college student living in a residence hall, or if you have one of several medical conditions. You may also need additional booster doses.  Hepatitis A vaccine. You may need this if you have certain conditions or if you travel or work in places where you may be exposed to hepatitis A.  Hepatitis B vaccine. You may need this if you have certain conditions or if you travel or work in places where you may be exposed to hepatitis B.  Haemophilus influenzae type b (Hib) vaccine. You may need this if you have certain risk factors. Talk to your health care provider about which screenings and vaccines you need and how often you need them. This information is not intended to replace advice given to you by your health care provider. Make sure you discuss any questions you have with your health care provider. Document Released: 08/14/2001 Document Revised: 01/29/2017 Document Reviewed: 04/19/2015 Elsevier Interactive Patient Education  2019 [employer]  

## 2018-09-05 NOTE — Progress Notes (Signed)
0 Patient ID: Levi Berger, male    DOB: 12-07-1980  Age: 38 y.o. MRN: [id number]    Subjective:  Subjective  HPI Levi Berger presents for cpe .  No complaints.    Review of Systems  Constitutional: Negative for appetite change, chills, diaphoresis, fatigue, fever and unexpected weight change.  HENT: Negative for congestion and hearing loss.   Eyes: Negative for pain, discharge, redness and visual disturbance.  Respiratory: Negative for cough, chest tightness, shortness of breath and wheezing.   Cardiovascular: Negative for chest pain, palpitations and leg swelling.  Gastrointestinal: Negative for abdominal pain, blood in stool, constipation, diarrhea, nausea and vomiting.  Endocrine: Negative for cold intolerance, heat intolerance, polydipsia, polyphagia and polyuria.  Genitourinary: Negative for difficulty urinating, dysuria, frequency, hematuria and urgency.  Musculoskeletal: Negative for back pain and myalgias.  Skin: Negative for rash.  Allergic/Immunologic: Negative for environmental allergies.  Neurological: Negative for dizziness, weakness, light-headedness, numbness and headaches.  Hematological: Does not bruise/bleed easily.  Psychiatric/Behavioral: Negative for suicidal ideas. The patient is not nervous/anxious.     History Past Medical History:  Diagnosis Date  . Generalized anxiety disorder 12/26/2015  . Palpitations     He has a past surgical history that includes None and transthoracic echocardiogram (01/2012).   His family history includes Alcohol abuse in his maternal grandfather; Arthritis in his paternal grandfather; Colon cancer in his paternal grandfather; Diabetes in his father and paternal grandfather; Heart disease in his maternal grandfather; Hypertension in his father; Lung cancer in his father.He reports that he is a non-smoker but has been exposed to tobacco smoke. He has quit using smokeless tobacco. He reports current alcohol  use of about 5.0 standard drinks of alcohol per week. He reports that he does not use drugs.  Current Outpatient Medications on File Prior to Visit  Medication Sig Dispense Refill  . ibuprofen (ADVIL,MOTRIN) 800 MG tablet Take 1 tablet (800 mg total) by mouth every 8 (eight) hours as needed. 30 tablet 3   No current facility-administered medications on file prior to visit.      Objective:  Objective  Physical Exam Vitals signs and nursing note reviewed.  Constitutional:      General: He is not in acute distress.    Appearance: He is well-developed. He is not diaphoretic.  HENT:     Head: Normocephalic and atraumatic.     Right Ear: External ear normal.     Left Ear: External ear normal.     Nose: Nose normal.     Mouth/Throat:     Pharynx: No oropharyngeal exudate.  Eyes:     General:        Right eye: No discharge.        Left eye: No discharge.     Conjunctiva/sclera: Conjunctivae normal.     Pupils: Pupils are equal, round, and reactive to light.  Neck:     Musculoskeletal: Normal range of motion and neck supple.     Thyroid: No thyromegaly.     Vascular: No JVD.  Cardiovascular:     Rate and Rhythm: Normal rate and regular rhythm.     Heart sounds: No murmur. No friction rub. No gallop.   Pulmonary:     Effort: Pulmonary effort is normal. No respiratory distress.     Breath sounds: Normal breath sounds. No wheezing or rales.  Chest:     Chest wall: No tenderness.  Abdominal:  General: Bowel sounds are normal. There is no distension.     Palpations: Abdomen is soft. There is no mass.     Tenderness: There is no abdominal tenderness. There is no guarding or rebound.  Musculoskeletal: Normal range of motion.        General: No tenderness.  Lymphadenopathy:     Cervical: No cervical adenopathy.  Skin:    General: Skin is warm and dry.     Coloration: Skin is not pale.     Findings: No erythema or rash.  Neurological:     Mental Status: He is alert and oriented  to person, place, and time.     Motor: No abnormal muscle tone.     Deep Tendon Reflexes: Reflexes are normal and symmetric. Reflexes normal.  Psychiatric:        Behavior: Behavior normal.        Thought Content: Thought content normal.        Judgment: Judgment normal.    BP 136/88   Pulse 92   Temp 98.6 F (37 C)   Resp 12   Ht 6' 1.5" (1.867 m)   Wt 267 lb (121.1 kg)   SpO2 96%   BMI 34.75 kg/m  Wt Readings from Last 3 Encounters:  09/05/18 267 lb (121.1 kg)  10/07/17 284 lb (128.8 kg)  08/28/17 291 lb (132 kg)     Lab Results  Component Value Date   WBC 6.2 09/05/2018   HGB 15.6 09/05/2018   HCT 46.1 09/05/2018   PLT 240 09/05/2018   GLUCOSE 80 09/05/2018   CHOL 217 (H) 09/05/2018   TRIG 153 (H) 09/05/2018   HDL 39 (L) 09/05/2018   LDLCALC 150 (H) 09/05/2018   ALT 19 09/05/2018   AST 14 09/05/2018   NA 141 09/05/2018   K 4.5 09/05/2018   CL 103 09/05/2018   CREATININE 1.29 09/05/2018   BUN 13 09/05/2018   CO2 29 09/05/2018   TSH 0.91 09/05/2018    Mr Brain Wo Contrast  Result Date: 12/17/2015 CLINICAL DATA:  Unexplained persistent illness, associated with numbness and tingling on the RIGHT side, elevated blood pressure, lightheadedness, brain fog and visual abnormalities since March 2017. EXAM: MRI HEAD WITHOUT CONTRAST TECHNIQUE: Multiplanar, multiecho pulse sequences of the brain and surrounding structures were obtained without intravenous contrast. COMPARISON:  None. FINDINGS: No evidence for acute infarction, hemorrhage, mass lesion, hydrocephalus, or extra-axial fluid. Normal cerebral volume. No white matter disease. Pituitary, pineal, and cerebellar tonsils unremarkable. No upper cervical lesions. Flow voids are maintained throughout the carotid, basilar, and vertebral arteries. There are no areas of chronic hemorrhage. Visualized calvarium, skull base, and upper cervical osseous structures unremarkable. Scalp and extracranial soft tissues, orbits,  sinuses, and mastoids show no acute process. Maxillary and sphenoid sinus retention cysts, non worrisome. IMPRESSION: Negative exam.  No cause seen for reported symptoms. Electronically Signed   By: Elsie Stain M.D.   On: 12/17/2015 14:37     Assessment & Plan:  Plan  I have discontinued Levi Berger "Al"'s doxycycline. I am also having him maintain his ibuprofen and PARoxetine.  Meds ordered this encounter  Medications  . PARoxetine (PAXIL) 20 MG tablet    Sig: Take 1 tablet (20 mg total) by mouth daily.    Dispense:  90 tablet    Refill:  0    Problem List Items Addressed This Visit      Unprioritized   Generalized anxiety disorder    Stable  con't  paxil      Relevant Medications   PARoxetine (PAXIL) 20 MG tablet   Palpitations   Relevant Medications   PARoxetine (PAXIL) 20 MG tablet   Preventative health care - Primary    ghm utd Check labs See AVS      Relevant Orders   CBC with Differential/Platelet (Completed)   Comprehensive metabolic panel (Completed)   Lipid panel (Completed)   TSH (Completed)      Follow-up: Return in about 1 year (around 09/05/2019), or if symptoms worsen or fail to improve, for annual exam, fasting.  [name], DO

## 2018-09-06 LAB — CBC WITH DIFFERENTIAL/PLATELET
Absolute Monocytes: 595 cells/uL (ref 200–950)
BASOS ABS: 50 {cells}/uL (ref 0–200)
Basophils Relative: 0.8 %
EOS PCT: 2.1 %
Eosinophils Absolute: 130 cells/uL (ref 15–500)
HEMATOCRIT: 46.1 % (ref 38.5–50.0)
Hemoglobin: 15.6 g/dL (ref 13.2–17.1)
LYMPHS ABS: 1066 {cells}/uL (ref 850–3900)
MCH: 31.1 pg (ref 27.0–33.0)
MCHC: 33.8 g/dL (ref 32.0–36.0)
MCV: 91.8 fL (ref 80.0–100.0)
MPV: 11.1 fL (ref 7.5–12.5)
Monocytes Relative: 9.6 %
NEUTROS PCT: 70.3 %
Neutro Abs: 4359 cells/uL (ref 1500–7800)
Platelets: 240 10*3/uL (ref 140–400)
RBC: 5.02 10*6/uL (ref 4.20–5.80)
RDW: 13.2 % (ref 11.0–15.0)
Total Lymphocyte: 17.2 %
WBC: 6.2 10*3/uL (ref 3.8–10.8)

## 2018-09-06 LAB — COMPREHENSIVE METABOLIC PANEL
AG Ratio: 1.8 (calc) (ref 1.0–2.5)
ALT: 19 U/L (ref 9–46)
AST: 14 U/L (ref 10–40)
Albumin: 4.2 g/dL (ref 3.6–5.1)
Alkaline phosphatase (APISO): 67 U/L (ref 36–130)
BILIRUBIN TOTAL: 0.7 mg/dL (ref 0.2–1.2)
BUN: 13 mg/dL (ref 7–25)
CALCIUM: 9.4 mg/dL (ref 8.6–10.3)
CO2: 29 mmol/L (ref 20–32)
Chloride: 103 mmol/L (ref 98–110)
Creat: 1.29 mg/dL (ref 0.60–1.35)
Globulin: 2.4 g/dL (calc) (ref 1.9–3.7)
Glucose, Bld: 80 mg/dL (ref 65–99)
Potassium: 4.5 mmol/L (ref 3.5–5.3)
SODIUM: 141 mmol/L (ref 135–146)
TOTAL PROTEIN: 6.6 g/dL (ref 6.1–8.1)

## 2018-09-06 LAB — TSH: TSH: 0.91 m[IU]/L (ref 0.40–4.50)

## 2018-09-06 LAB — LIPID PANEL
CHOL/HDL RATIO: 5.6 (calc) — AB (ref <5.0)
Cholesterol: 217 mg/dL — ABNORMAL HIGH (ref <200)
HDL: 39 mg/dL — ABNORMAL LOW (ref >=40)
LDL CHOLESTEROL (CALC): 150 mg/dL — AB
NON-HDL CHOLESTEROL (CALC): 178 mg/dL — AB (ref <130)
TRIGLYCERIDES: 153 mg/dL — AB (ref <150)

## 2018-09-07 NOTE — Assessment & Plan Note (Signed)
ghm utd Check labs See AVS 

## 2018-09-07 NOTE — Assessment & Plan Note (Signed)
Stable con't paxil  

## 2018-09-09 ENCOUNTER — Encounter: Payer: Self-pay | Admitting: Family Medicine

## 2018-09-09 ENCOUNTER — Other Ambulatory Visit: Payer: Self-pay | Admitting: Family Medicine

## 2018-09-09 DIAGNOSIS — E785 Hyperlipidemia, unspecified: Secondary | ICD-10-CM

## 2018-09-09 MED ORDER — ROSUVASTATIN CALCIUM 10 MG PO TABS: 10.0000 mg | ORAL_TABLET | Freq: Every day | ORAL | 3 refills | Status: DC

## 2018-09-10 NOTE — Telephone Encounter (Signed)
We can wait 2 months ---

## 2018-12-10 ENCOUNTER — Other Ambulatory Visit: Payer: Self-pay

## 2018-12-10 ENCOUNTER — Other Ambulatory Visit (INDEPENDENT_AMBULATORY_CARE_PROVIDER_SITE_OTHER): Payer: 59

## 2018-12-10 DIAGNOSIS — E785 Hyperlipidemia, unspecified: Secondary | ICD-10-CM | POA: Diagnosis not present

## 2018-12-10 LAB — LIPID PANEL
Cholesterol: 182 mg/dL (ref 0–200)
HDL: 42.6 mg/dL (ref >39.00)
LDL Cholesterol: 121 mg/dL — ABNORMAL HIGH (ref 0–99)
NonHDL: 139.32
Total CHOL/HDL Ratio: 4
Triglycerides: 93 mg/dL (ref 0.0–149.0)
VLDL: 18.6 mg/dL (ref 0.0–40.0)

## 2018-12-10 LAB — COMPREHENSIVE METABOLIC PANEL
ALT: 17 U/L (ref 0–53)
AST: 16 U/L (ref 0–37)
Albumin: 4.4 g/dL (ref 3.5–5.2)
Alkaline Phosphatase: 72 U/L (ref 39–117)
BUN: 11 mg/dL (ref 6–23)
CO2: 30 mEq/L (ref 19–32)
Calcium: 9.5 mg/dL (ref 8.4–10.5)
Chloride: 102 mEq/L (ref 96–112)
Creatinine, Ser: 1.12 mg/dL (ref 0.40–1.50)
GFR: 72.52 mL/min (ref >60.00)
Glucose, Bld: 86 mg/dL (ref 70–99)
Potassium: 4.1 mEq/L (ref 3.5–5.1)
Sodium: 139 mEq/L (ref 135–145)
Total Bilirubin: 0.6 mg/dL (ref 0.2–1.2)
Total Protein: 6.9 g/dL (ref 6.0–8.3)

## 2018-12-15 ENCOUNTER — Other Ambulatory Visit: Payer: Self-pay | Admitting: Family Medicine

## 2018-12-15 DIAGNOSIS — E785 Hyperlipidemia, unspecified: Secondary | ICD-10-CM

## 2018-12-15 DIAGNOSIS — R002 Palpitations: Secondary | ICD-10-CM

## 2018-12-15 MED ORDER — PAROXETINE HCL 20 MG PO TABS: 20.0000 mg | ORAL_TABLET | Freq: Every day | ORAL | 1 refills | Status: DC

## 2018-12-17 ENCOUNTER — Other Ambulatory Visit: Payer: Self-pay

## 2018-12-17 DIAGNOSIS — E785 Hyperlipidemia, unspecified: Secondary | ICD-10-CM

## 2018-12-17 MED ORDER — ROSUVASTATIN CALCIUM 20 MG PO TABS: 20.0000 mg | ORAL_TABLET | Freq: Every day | ORAL | 2 refills | Status: DC

## 2019-06-27 ENCOUNTER — Other Ambulatory Visit: Payer: Self-pay | Admitting: Family Medicine

## 2019-06-27 DIAGNOSIS — R002 Palpitations: Secondary | ICD-10-CM

## 2019-09-22 ENCOUNTER — Other Ambulatory Visit: Payer: Self-pay | Admitting: Family Medicine

## 2019-09-22 DIAGNOSIS — R002 Palpitations: Secondary | ICD-10-CM

## 2019-10-07 ENCOUNTER — Other Ambulatory Visit: Payer: Self-pay

## 2019-10-08 ENCOUNTER — Ambulatory Visit (INDEPENDENT_AMBULATORY_CARE_PROVIDER_SITE_OTHER): Payer: 59 | Admitting: Family Medicine

## 2019-10-08 ENCOUNTER — Encounter: Payer: Self-pay | Admitting: Family Medicine

## 2019-10-08 ENCOUNTER — Other Ambulatory Visit: Payer: Self-pay

## 2019-10-08 VITALS — BP 128/82 | HR 87 | Temp 98.0°F | Resp 18 | Ht 73.5 in | Wt 270.2 lb

## 2019-10-08 DIAGNOSIS — R002 Palpitations: Secondary | ICD-10-CM

## 2019-10-08 DIAGNOSIS — Z0000 Encounter for general adult medical examination without abnormal findings: Secondary | ICD-10-CM | POA: Diagnosis not present

## 2019-10-08 DIAGNOSIS — F411 Generalized anxiety disorder: Secondary | ICD-10-CM

## 2019-10-08 MED ORDER — PAROXETINE HCL 20 MG PO TABS: 20.0000 mg | ORAL_TABLET | Freq: Every day | ORAL | 3 refills | Status: DC

## 2019-10-08 NOTE — Progress Notes (Signed)
Patient ID: Levi Berger, male    DOB: 1980/12/24  Age: 39 y.o. MRN: [id number]    Subjective:  Subjective  HPI Levi Berger presents for cpe   No complaints  He is also f/u on his anxiety --- it has been stable with meds   Review of Systems  Constitutional: Negative.  Negative for appetite change, diaphoresis, fatigue and unexpected weight change.  HENT: Negative for congestion, ear pain, hearing loss, nosebleeds, postnasal drip, rhinorrhea, sinus pressure, sneezing and tinnitus.   Eyes: Negative for photophobia, pain, discharge, redness, itching and visual disturbance.  Respiratory: Negative.  Negative for cough, chest tightness, shortness of breath and wheezing.   Cardiovascular: Negative.  Negative for chest pain, palpitations and leg swelling.  Gastrointestinal: Negative for abdominal distention, abdominal pain, anal bleeding, blood in stool and constipation.  Endocrine: Negative.  Negative for cold intolerance, heat intolerance, polydipsia, polyphagia and polyuria.  Genitourinary: Negative.  Negative for difficulty urinating, dysuria and frequency.  Musculoskeletal: Negative.   Skin: Negative.   Allergic/Immunologic: Negative.   Neurological: Negative for dizziness, weakness, light-headedness, numbness and headaches.  Psychiatric/Behavioral: Negative for agitation, confusion, decreased concentration, dysphoric mood, sleep disturbance and suicidal ideas. The patient is not nervous/anxious.     History Past Medical History:  Diagnosis Date  . Generalized anxiety disorder 12/26/2015  . Palpitations     He has a past surgical history that includes None and transthoracic echocardiogram (01/2012).   His family history includes Alcohol abuse in his maternal grandfather; Arthritis in his paternal grandfather; Atrial fibrillation in his mother; Colon cancer in his paternal grandfather; Diabetes in his father and paternal grandfather; Heart disease in his maternal grandfather;  Hypertension in his father; Lung cancer in his father.He reports that he is a non-smoker but has been exposed to tobacco smoke. He has quit using smokeless tobacco. He reports current alcohol use of about 5.0 standard drinks of alcohol per week. He reports that he does not use drugs.  Current Outpatient Medications on File Prior to Visit  Medication Sig Dispense Refill  . ibuprofen (ADVIL,MOTRIN) 800 MG tablet Take 1 tablet (800 mg total) by mouth every 8 (eight) hours as needed. 30 tablet 3  . rosuvastatin (CRESTOR) 20 MG tablet Take 1 tablet (20 mg total) by mouth daily. (Patient not taking: Reported on 10/08/2019) 30 tablet 2   No current facility-administered medications on file prior to visit.     Objective:  Objective  Physical Exam Constitutional:      General: He is not in acute distress.    Appearance: He is well-developed. He is not diaphoretic.  HENT:     Head: Normocephalic and atraumatic.     Right Ear: External ear normal.     Left Ear: External ear normal.     Nose: Nose normal.     Mouth/Throat:     Pharynx: No oropharyngeal exudate.  Eyes:     General:        Right eye: No discharge.        Left eye: No discharge.     Conjunctiva/sclera: Conjunctivae normal.     Pupils: Pupils are equal, round, and reactive to light.  Neck:     Thyroid: No thyromegaly.     Vascular: No JVD.  Cardiovascular:     Rate and Rhythm: Normal rate and regular rhythm.     Heart sounds: No murmur. No friction rub. No gallop.   Pulmonary:     Effort: Pulmonary effort is normal. No respiratory  distress.     Breath sounds: Normal breath sounds. No wheezing or rales.  Chest:     Chest wall: No tenderness.  Abdominal:     General: Bowel sounds are normal. There is no distension.     Palpations: Abdomen is soft. There is no mass.     Tenderness: There is no abdominal tenderness. There is no guarding or rebound.  Genitourinary:    Penis: Normal.      Prostate: Normal.     Rectum: Normal.  Guaiac result negative.  Musculoskeletal:        General: No tenderness. Normal range of motion.     Cervical back: Normal range of motion and neck supple.  Lymphadenopathy:     Cervical: No cervical adenopathy.  Skin:    General: Skin is warm and dry.     Coloration: Skin is not pale.     Findings: No erythema or rash.  Neurological:     Mental Status: He is alert and oriented to person, place, and time.     Motor: No abnormal muscle tone.     Deep Tendon Reflexes: Reflexes are normal and symmetric. Reflexes normal.  Psychiatric:        Behavior: Behavior normal.        Thought Content: Thought content normal.        Judgment: Judgment normal.    BP 128/82 (BP Location: Right Arm, Patient Position: Sitting, Cuff Size: Large)   Pulse 87   Temp 98 F (36.7 C) (Temporal)   Resp 18   Ht 6' 1.5" (1.867 m)   Wt 270 lb 3.2 oz (122.6 kg)   SpO2 98%   BMI 35.17 kg/m  Wt Readings from Last 3 Encounters:  10/08/19 270 lb 3.2 oz (122.6 kg)  09/05/18 267 lb (121.1 kg)  10/07/17 284 lb (128.8 kg)     Lab Results  Component Value Date   WBC 6.2 09/05/2018   HGB 15.6 09/05/2018   HCT 46.1 09/05/2018   PLT 240 09/05/2018   GLUCOSE 86 12/10/2018   CHOL 182 12/10/2018   TRIG 93.0 12/10/2018   HDL 42.60 12/10/2018   LDLCALC 121 (H) 12/10/2018   ALT 17 12/10/2018   AST 16 12/10/2018   NA 139 12/10/2018   K 4.1 12/10/2018   CL 102 12/10/2018   CREATININE 1.12 12/10/2018   BUN 11 12/10/2018   CO2 30 12/10/2018   TSH 0.91 09/05/2018   Health Maintenance  Topic Date Due  . HIV Screening  Never done  . TETANUS/TDAP  10/31/2018  . INFLUENZA VACCINE  01/31/2020     MR Brain Wo Contrast  Result Date: 12/17/2015 CLINICAL DATA:  Unexplained persistent illness, associated with numbness and tingling on the RIGHT side, elevated blood pressure, lightheadedness, brain fog and visual abnormalities since March 2017. EXAM: MRI HEAD WITHOUT CONTRAST TECHNIQUE: Multiplanar, multiecho  pulse sequences of the brain and surrounding structures were obtained without intravenous contrast. COMPARISON:  None. FINDINGS: No evidence for acute infarction, hemorrhage, mass lesion, hydrocephalus, or extra-axial fluid. Normal cerebral volume. No white matter disease. Pituitary, pineal, and cerebellar tonsils unremarkable. No upper cervical lesions. Flow voids are maintained throughout the carotid, basilar, and vertebral arteries. There are no areas of chronic hemorrhage. Visualized calvarium, skull base, and upper cervical osseous structures unremarkable. Scalp and extracranial soft tissues, orbits, sinuses, and mastoids show no acute process. Maxillary and sphenoid sinus retention cysts, non worrisome. IMPRESSION: Negative exam.  No cause seen for reported symptoms. Electronically Signed  By: Elsie Stain M.D.   On: 12/17/2015 14:37     Assessment & Plan:  Plan  I have changed Levi Berger "Al"'s PARoxetine. I am also having him maintain his ibuprofen and rosuvastatin.  Meds ordered this encounter  Medications  . PARoxetine (PAXIL) 20 MG tablet    Sig: Take 1 tablet (20 mg total) by mouth daily.    Dispense:  90 tablet    Refill:  3    Problem List Items Addressed This Visit      Unprioritized   Generalized anxiety disorder    Stable con't paxil       Relevant Medications   PARoxetine (PAXIL) 20 MG tablet   Palpitations   Relevant Medications   PARoxetine (PAXIL) 20 MG tablet   Preventative health care - Primary    ghm utd Check labs Pt will need to wait a month for tdap See  AVS      Relevant Orders   TSH   PSA   CBC with Differential/Platelet   Lipid panel   Comprehensive metabolic panel      Follow-up: Return in about 1 year (around 10/07/2020), or if symptoms worsen or fail to improve, for annual exam, fasting.  [name], DO

## 2019-10-08 NOTE — Assessment & Plan Note (Signed)
ghm utd Check labs Pt will need to wait a month for tdap See  AVS

## 2019-10-08 NOTE — Patient Instructions (Signed)
Preventive Care 40-41 Years Old, Male Preventive care refers to lifestyle choices and visits with your health care provider that can promote health and wellness. This includes:  A yearly physical exam. This is also called an annual well check.  Regular dental and eye exams.  Immunizations.  Screening for certain conditions.  Healthy lifestyle choices, such as eating a healthy diet, getting regular exercise, not using drugs or products that contain nicotine and tobacco, and limiting alcohol use. What can I expect for my preventive care visit? Physical exam Your health care provider will check:  Height and weight. These may be used to calculate body mass index (BMI), which is a measurement that tells if you are at a healthy weight.  Heart rate and blood pressure.  Your skin for abnormal spots. Counseling Your health care provider may ask you questions about:  Alcohol, tobacco, and drug use.  Emotional well-being.  Home and relationship well-being.  Sexual activity.  Eating habits.  Work and work environment. What immunizations do I need?  Influenza (flu) vaccine  This is recommended every year. Tetanus, diphtheria, and pertussis (Tdap) vaccine  You may need a Td booster every 10 years. Varicella (chickenpox) vaccine  You may need this vaccine if you have not already been vaccinated. Zoster (shingles) vaccine  You may need this after age 60. Measles, mumps, and rubella (MMR) vaccine  You may need at least one dose of MMR if you were born in 1957 or later. You may also need a second dose. Pneumococcal conjugate (PCV13) vaccine  You may need this if you have certain conditions and were not previously vaccinated. Pneumococcal polysaccharide (PPSV23) vaccine  You may need one or two doses if you smoke cigarettes or if you have certain conditions. Meningococcal conjugate (MenACWY) vaccine  You may need this if you have certain conditions. Hepatitis A vaccine   You may need this if you have certain conditions or if you travel or work in places where you may be exposed to hepatitis A. Hepatitis B vaccine  You may need this if you have certain conditions or if you travel or work in places where you may be exposed to hepatitis B. Haemophilus influenzae type b (Hib) vaccine  You may need this if you have certain risk factors. Human papillomavirus (HPV) vaccine  If recommended by your health care provider, you may need three doses over 6 months. You may receive vaccines as individual doses or as more than one vaccine together in one shot (combination vaccines). Talk with your health care provider about the risks and benefits of combination vaccines. What tests do I need? Blood tests  Lipid and cholesterol levels. These may be checked every 5 years, or more frequently if you are over 50 years old.  Hepatitis C test.  Hepatitis B test. Screening  Lung cancer screening. You may have this screening every year starting at age 55 if you have a 30-pack-year history of smoking and currently smoke or have quit within the past 15 years.  Prostate cancer screening. Recommendations will vary depending on your family history and other risks.  Colorectal cancer screening. All adults should have this screening starting at age 50 and continuing until age 75. Your health care provider may recommend screening at age 45 if you are at increased risk. You will have tests every 1-10 years, depending on your results and the type of screening test.  Diabetes screening. This is done by checking your blood sugar (glucose) after you have not eaten   for a while (fasting). You may have this done every 1-3 years.  Sexually transmitted disease (STD) testing. Follow these instructions at home: Eating and drinking  Eat a diet that includes fresh fruits and vegetables, whole grains, lean protein, and low-fat dairy products.  Take vitamin and mineral supplements as  recommended by your health care provider.  Do not drink alcohol if your health care provider tells you not to drink.  If you drink alcohol: ? Limit how much you have to 0-2 drinks a day. ? Be aware of how much alcohol is in your drink. In the [place] one drink equals one 12 oz bottle of beer (355 mL), one 5 oz glass of wine (148 mL), or one 1 oz glass of hard liquor (44 mL). Lifestyle  Take daily care of your teeth and gums.  Stay active. Exercise for at least 30 minutes on 5 or more days each week.  Do not use any products that contain nicotine or tobacco, such as cigarettes, e-cigarettes, and chewing tobacco. If you need help quitting, ask your health care provider.  If you are sexually active, practice safe sex. Use a condom or other form of protection to prevent STIs (sexually transmitted infections).  Talk with your health care provider about taking a low-dose aspirin every day starting at age 50. What's next?  Go to your health care provider once a year for a well check visit.  Ask your health care provider how often you should have your eyes and teeth checked.  Stay up to date on all vaccines. This information is not intended to replace advice given to you by your health care provider. Make sure you discuss any questions you have with your health care provider. Document Revised: 06/12/2018 Document Reviewed: 06/12/2018 [employer]vier Patient Education  2020 [employer]  

## 2019-10-08 NOTE — Assessment & Plan Note (Signed)
Stable con't paxil  

## 2019-10-09 LAB — CBC WITH DIFFERENTIAL/PLATELET
Basophils Absolute: 0.1 10*3/uL (ref 0.0–0.1)
Basophils Relative: 1.2 % (ref 0.0–3.0)
Eosinophils Absolute: 0.2 10*3/uL (ref 0.0–0.7)
Eosinophils Relative: 2.9 % (ref 0.0–5.0)
HCT: 43.8 % (ref 39.0–52.0)
Hemoglobin: 15 g/dL (ref 13.0–17.0)
Lymphocytes Relative: 23 % (ref 12.0–46.0)
Lymphs Abs: 1.4 10*3/uL (ref 0.7–4.0)
MCHC: 34.2 g/dL (ref 30.0–36.0)
MCV: 92.1 fl (ref 78.0–100.0)
Monocytes Absolute: 0.5 10*3/uL (ref 0.1–1.0)
Monocytes Relative: 8.4 % (ref 3.0–12.0)
Neutro Abs: 4 10*3/uL (ref 1.4–7.7)
Neutrophils Relative %: 64.5 % (ref 43.0–77.0)
Platelets: 221 10*3/uL (ref 150.0–400.0)
RBC: 4.76 Mil/uL (ref 4.22–5.81)
RDW: 13.3 % (ref 11.5–15.5)
WBC: 6.2 10*3/uL (ref 4.0–10.5)

## 2019-10-09 LAB — LIPID PANEL
Cholesterol: 208 mg/dL — ABNORMAL HIGH (ref 0–200)
HDL: 37.8 mg/dL — ABNORMAL LOW (ref >39.00)
LDL Cholesterol: 139 mg/dL — ABNORMAL HIGH (ref 0–99)
NonHDL: 169.78
Total CHOL/HDL Ratio: 5
Triglycerides: 152 mg/dL — ABNORMAL HIGH (ref 0.0–149.0)
VLDL: 30.4 mg/dL (ref 0.0–40.0)

## 2019-10-09 LAB — TSH: TSH: 2.14 u[IU]/mL (ref 0.35–4.50)

## 2019-10-09 LAB — COMPREHENSIVE METABOLIC PANEL
ALT: 18 U/L (ref 0–53)
AST: 15 U/L (ref 0–37)
Albumin: 4.3 g/dL (ref 3.5–5.2)
Alkaline Phosphatase: 68 U/L (ref 39–117)
BUN: 20 mg/dL (ref 6–23)
CO2: 31 mEq/L (ref 19–32)
Calcium: 9.6 mg/dL (ref 8.4–10.5)
Chloride: 102 mEq/L (ref 96–112)
Creatinine, Ser: 1.03 mg/dL (ref 0.40–1.50)
GFR: 79.55 mL/min (ref >60.00)
Glucose, Bld: 89 mg/dL (ref 70–99)
Potassium: 4.6 mEq/L (ref 3.5–5.1)
Sodium: 139 mEq/L (ref 135–145)
Total Bilirubin: 0.5 mg/dL (ref 0.2–1.2)
Total Protein: 6.5 g/dL (ref 6.0–8.3)

## 2019-10-09 LAB — PSA: PSA: 0.6 ng/mL (ref 0.10–4.00)

## 2019-10-11 ENCOUNTER — Other Ambulatory Visit: Payer: Self-pay | Admitting: Family Medicine

## 2019-10-11 DIAGNOSIS — E785 Hyperlipidemia, unspecified: Secondary | ICD-10-CM

## 2020-01-12 ENCOUNTER — Other Ambulatory Visit (INDEPENDENT_AMBULATORY_CARE_PROVIDER_SITE_OTHER): Payer: 59

## 2020-01-12 ENCOUNTER — Other Ambulatory Visit: Payer: Self-pay

## 2020-01-12 DIAGNOSIS — E785 Hyperlipidemia, unspecified: Secondary | ICD-10-CM | POA: Diagnosis not present

## 2020-01-12 LAB — LIPID PANEL
Cholesterol: 205 mg/dL — ABNORMAL HIGH (ref 0–200)
HDL: 36 mg/dL — ABNORMAL LOW (ref >39.00)
LDL Cholesterol: 142 mg/dL — ABNORMAL HIGH (ref 0–99)
NonHDL: 169.08
Total CHOL/HDL Ratio: 6
Triglycerides: 137 mg/dL (ref 0.0–149.0)
VLDL: 27.4 mg/dL (ref 0.0–40.0)

## 2020-01-12 LAB — COMPREHENSIVE METABOLIC PANEL
ALT: 36 U/L (ref 0–53)
AST: 26 U/L (ref 0–37)
Albumin: 4.3 g/dL (ref 3.5–5.2)
Alkaline Phosphatase: 59 U/L (ref 39–117)
BUN: 14 mg/dL (ref 6–23)
CO2: 32 mEq/L (ref 19–32)
Calcium: 9.4 mg/dL (ref 8.4–10.5)
Chloride: 103 mEq/L (ref 96–112)
Creatinine, Ser: 1.09 mg/dL (ref 0.40–1.50)
GFR: 74.43 mL/min (ref >60.00)
Glucose, Bld: 99 mg/dL (ref 70–99)
Potassium: 4.1 mEq/L (ref 3.5–5.1)
Sodium: 138 mEq/L (ref 135–145)
Total Bilirubin: 0.3 mg/dL (ref 0.2–1.2)
Total Protein: 6.5 g/dL (ref 6.0–8.3)

## 2020-01-20 ENCOUNTER — Other Ambulatory Visit: Payer: Self-pay

## 2020-01-20 DIAGNOSIS — E785 Hyperlipidemia, unspecified: Secondary | ICD-10-CM

## 2020-04-15 NOTE — Addendum Note (Signed)
Addended by: Kailon Treese A on: 04/15/2020 03:16 PM   Modules accepted: Orders  

## 2020-04-18 ENCOUNTER — Other Ambulatory Visit (INDEPENDENT_AMBULATORY_CARE_PROVIDER_SITE_OTHER): Payer: 59

## 2020-04-18 ENCOUNTER — Other Ambulatory Visit: Payer: Self-pay

## 2020-04-18 DIAGNOSIS — E785 Hyperlipidemia, unspecified: Secondary | ICD-10-CM

## 2020-04-19 LAB — COMPREHENSIVE METABOLIC PANEL
AG Ratio: 1.9 (calc) (ref 1.0–2.5)
ALT: 18 U/L (ref 9–46)
AST: 15 U/L (ref 10–40)
Albumin: 4.4 g/dL (ref 3.6–5.1)
Alkaline phosphatase (APISO): 65 U/L (ref 36–130)
BUN: 13 mg/dL (ref 7–25)
CO2: 30 mmol/L (ref 20–32)
Calcium: 9.6 mg/dL (ref 8.6–10.3)
Chloride: 101 mmol/L (ref 98–110)
Creat: 1.07 mg/dL (ref 0.60–1.35)
Globulin: 2.3 g/dL (calc) (ref 1.9–3.7)
Glucose, Bld: 81 mg/dL (ref 65–99)
Potassium: 4.5 mmol/L (ref 3.5–5.3)
Sodium: 138 mmol/L (ref 135–146)
Total Bilirubin: 0.7 mg/dL (ref 0.2–1.2)
Total Protein: 6.7 g/dL (ref 6.1–8.1)

## 2020-04-19 LAB — LIPID PANEL
Cholesterol: 195 mg/dL (ref <200)
HDL: 42 mg/dL (ref >=40)
LDL Cholesterol (Calc): 133 mg/dL (calc) — ABNORMAL HIGH
Non-HDL Cholesterol (Calc): 153 mg/dL (calc) — ABNORMAL HIGH (ref <130)
Total CHOL/HDL Ratio: 4.6 (calc) (ref <5.0)
Triglycerides: 101 mg/dL (ref <150)

## 2020-04-22 ENCOUNTER — Other Ambulatory Visit: Payer: Self-pay | Admitting: Family Medicine

## 2020-09-11 ENCOUNTER — Other Ambulatory Visit: Payer: Self-pay

## 2020-09-11 ENCOUNTER — Encounter (HOSPITAL_COMMUNITY): Payer: Self-pay

## 2020-09-11 ENCOUNTER — Emergency Department (HOSPITAL_COMMUNITY)
Admission: EM | Admit: 2020-09-11 | Discharge: 2020-09-11 | Disposition: A | Discharge status: Home / Self Care | Payer: No Typology Code available for payment source | Attending: Emergency Medicine | Admitting: Emergency Medicine

## 2020-09-11 DIAGNOSIS — S6991XA Unspecified injury of right wrist, hand and finger(s), initial encounter: Secondary | ICD-10-CM | POA: Diagnosis present

## 2020-09-11 DIAGNOSIS — Z7722 Contact with and (suspected) exposure to environmental tobacco smoke (acute) (chronic): Secondary | ICD-10-CM | POA: Insufficient documentation

## 2020-09-11 DIAGNOSIS — S61200A Unspecified open wound of right index finger without damage to nail, initial encounter: Secondary | ICD-10-CM | POA: Diagnosis not present

## 2020-09-11 DIAGNOSIS — Z87891 Personal history of nicotine dependence: Secondary | ICD-10-CM | POA: Diagnosis not present

## 2020-09-11 DIAGNOSIS — Z23 Encounter for immunization: Secondary | ICD-10-CM | POA: Diagnosis not present

## 2020-09-11 DIAGNOSIS — Z7721 Contact with and (suspected) exposure to potentially hazardous body fluids: Secondary | ICD-10-CM

## 2020-09-11 DIAGNOSIS — W278XXA Contact with other nonpowered hand tool, initial encounter: Secondary | ICD-10-CM | POA: Insufficient documentation

## 2020-09-11 LAB — HEPATITIS PANEL, ACUTE
HCV Ab: NONREACTIVE
Hep A IgM: NONREACTIVE
Hep B C IgM: NONREACTIVE
Hepatitis B Surface Ag: NONREACTIVE

## 2020-09-11 LAB — RAPID HIV SCREEN (HIV 1/2 AB+AG)
HIV 1/2 Antibodies: NONREACTIVE
HIV-1 P24 Antigen - HIV24: NONREACTIVE

## 2020-09-11 MED ORDER — TETANUS-DIPHTH-ACELL PERTUSSIS 5-2.5-18.5 LF-MCG/0.5 IM SUSY
0.5000 mL | PREFILLED_SYRINGE | Freq: Once | INTRAMUSCULAR | Status: AC
  Administered 2020-09-11: 0.5 mL via INTRAMUSCULAR
  Filled 2020-09-11: qty 0.5
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
## 2020-09-11 NOTE — Discharge Instructions (Signed)
You will be contacted by HR at work for results of remainder of tests and further instructions if needed.

## 2020-11-02 ENCOUNTER — Other Ambulatory Visit: Payer: Self-pay

## 2020-11-02 DIAGNOSIS — R002 Palpitations: Secondary | ICD-10-CM

## 2020-11-02 MED ORDER — PAROXETINE HCL 20 MG PO TABS: 20.0000 mg | ORAL_TABLET | Freq: Every day | ORAL | 1 refills | Status: DC

## 2020-11-29 ENCOUNTER — Other Ambulatory Visit: Payer: Self-pay

## 2020-11-29 ENCOUNTER — Encounter: Payer: Self-pay | Admitting: Family Medicine

## 2020-11-29 ENCOUNTER — Ambulatory Visit (INDEPENDENT_AMBULATORY_CARE_PROVIDER_SITE_OTHER): Payer: 59 | Admitting: Family Medicine

## 2020-11-29 VITALS — BP 138/88 | HR 76 | Temp 98.6°F | Resp 18 | Ht 74.0 in | Wt 270.8 lb

## 2020-11-29 DIAGNOSIS — Z0000 Encounter for general adult medical examination without abnormal findings: Secondary | ICD-10-CM | POA: Diagnosis not present

## 2020-11-29 DIAGNOSIS — F411 Generalized anxiety disorder: Secondary | ICD-10-CM

## 2020-11-29 DIAGNOSIS — R030 Elevated blood-pressure reading, without diagnosis of hypertension: Secondary | ICD-10-CM | POA: Diagnosis not present

## 2020-11-29 DIAGNOSIS — F419 Anxiety disorder, unspecified: Secondary | ICD-10-CM | POA: Diagnosis not present

## 2020-11-29 DIAGNOSIS — Z125 Encounter for screening for malignant neoplasm of prostate: Secondary | ICD-10-CM | POA: Diagnosis not present

## 2020-11-29 LAB — LIPID PANEL
Cholesterol: 192 mg/dL (ref 0–200)
HDL: 40.3 mg/dL (ref >39.00)
LDL Cholesterol: 125 mg/dL — ABNORMAL HIGH (ref 0–99)
NonHDL: 151.61
Total CHOL/HDL Ratio: 5
Triglycerides: 133 mg/dL (ref 0.0–149.0)
VLDL: 26.6 mg/dL (ref 0.0–40.0)

## 2020-11-29 LAB — COMPREHENSIVE METABOLIC PANEL
ALT: 17 U/L (ref 0–53)
AST: 13 U/L (ref 0–37)
Albumin: 4.3 g/dL (ref 3.5–5.2)
Alkaline Phosphatase: 60 U/L (ref 39–117)
BUN: 17 mg/dL (ref 6–23)
CO2: 30 mEq/L (ref 19–32)
Calcium: 9.5 mg/dL (ref 8.4–10.5)
Chloride: 104 mEq/L (ref 96–112)
Creatinine, Ser: 1.02 mg/dL (ref 0.40–1.50)
GFR: 90.83 mL/min (ref >60.00)
Glucose, Bld: 92 mg/dL (ref 70–99)
Potassium: 4.5 mEq/L (ref 3.5–5.1)
Sodium: 139 mEq/L (ref 135–145)
Total Bilirubin: 0.7 mg/dL (ref 0.2–1.2)
Total Protein: 6.6 g/dL (ref 6.0–8.3)

## 2020-11-29 LAB — CBC WITH DIFFERENTIAL/PLATELET
Basophils Absolute: 0 10*3/uL (ref 0.0–0.1)
Basophils Relative: 0.6 % (ref 0.0–3.0)
Eosinophils Absolute: 0.2 10*3/uL (ref 0.0–0.7)
Eosinophils Relative: 3.4 % (ref 0.0–5.0)
HCT: 45.2 % (ref 39.0–52.0)
Hemoglobin: 15.3 g/dL (ref 13.0–17.0)
Lymphocytes Relative: 21.7 % (ref 12.0–46.0)
Lymphs Abs: 1.2 10*3/uL (ref 0.7–4.0)
MCHC: 33.9 g/dL (ref 30.0–36.0)
MCV: 91.9 fl (ref 78.0–100.0)
Monocytes Absolute: 0.5 10*3/uL (ref 0.1–1.0)
Monocytes Relative: 8.6 % (ref 3.0–12.0)
Neutro Abs: 3.8 10*3/uL (ref 1.4–7.7)
Neutrophils Relative %: 65.7 % (ref 43.0–77.0)
Platelets: 197 10*3/uL (ref 150.0–400.0)
RBC: 4.92 Mil/uL (ref 4.22–5.81)
RDW: 13.4 % (ref 11.5–15.5)
WBC: 5.7 10*3/uL (ref 4.0–10.5)

## 2020-11-29 LAB — TSH: TSH: 1.89 u[IU]/mL (ref 0.35–4.50)

## 2020-11-29 LAB — PSA: PSA: 1.76 ng/mL (ref 0.10–4.00)

## 2020-11-29 MED ORDER — PAROXETINE HCL 10 MG PO TABS: 10.0000 mg | ORAL_TABLET | Freq: Every day | ORAL | 3 refills | Status: DC

## 2020-11-29 NOTE — Progress Notes (Signed)
Subjective:   By signing my name below, I, [name], attest that this documentation has been prepared under the direction and in the presence of Dr. Seabron Spates, DO. 11/29/2020     Patient ID: Levi Berger, male    DOB: [birth year], 40 y.o.   MRN: [id number]  Chief Complaint  Patient presents with  . Annual Exam    Pt states fasting     HPI Patient is in today for a comprehensive physical exam. He is doing well at this time. His blood pressure is elevated during his visit. He no longer experiences issues with palpitations. He continues to take 20 mg Paxil daily PO and is interested in reducing his dosage. He continues to take fish oil to manage his cholesterol. He denies having any joint pain at this time. He is UTD on dental care. He has no recent change in his family history. He has 2 pfizer Covid-19 vaccines. He is UTD on tetanus vaccines.   Past Medical History:  Diagnosis Date  . Generalized anxiety disorder 12/26/2015  . Palpitations     Past Surgical History:  Procedure Laterality Date  . None    . TRANSTHORACIC ECHOCARDIOGRAM  01/2012   Normal LV size and thickness. Normal wall motion with EF of 55-60%. Possible grade 2 diastolic dysfunction. Otherwise normal valve function.    Family History  Problem Relation Age of Onset  . Lung cancer Father        Smoker  . Hypertension Father   . Diabetes Father   . Arthritis Paternal Grandfather   . Colon cancer Paternal Grandfather   . Diabetes Paternal Grandfather   . Heart disease Maternal Grandfather   . Alcohol abuse Maternal Grandfather   . Atrial fibrillation Mother     Social History   Socioeconomic History  . Marital status: Single    Spouse name: Not on file  . Number of children: 0  . Years of education: Not on file  . Highest education level: Not on file  Occupational History  . Occupation: [employer]    Employer: UNEMPLOYED  Tobacco Use  . Smoking status: Passive Smoke Exposure -  Never Smoker  . Smokeless tobacco: Former Neurosurgeon  . Tobacco comment: pt occ smokes a pipe maybe once or twice a month  Vaping Use  . Vaping Use: Never used  Substance and Sexual Activity  . Alcohol use: Yes    Alcohol/week: 5.0 standard drinks    Types: 5 Standard drinks or equivalent per week    Comment: ocassionally  . Drug use: No  . Sexual activity: Not on file  Other Topics Concern  . Not on file  Social History Narrative  . Not on file   Social Determinants of Health   Financial Resource Strain: Not on file  Food Insecurity: Not on file  Transportation Needs: Not on file  Physical Activity: Not on file  Stress: Not on file  Social Connections: Not on file  Intimate Partner Violence: Not on file    Outpatient Medications Prior to Visit  Medication Sig Dispense Refill  . ibuprofen (ADVIL,MOTRIN) 800 MG tablet Take 1 tablet (800 mg total) by mouth every 8 (eight) hours as needed. 30 tablet 3  . Krill Oil 500 MG CAPS Take by mouth.    Marland Kitchen PARoxetine (PAXIL) 20 MG tablet Take 1 tablet (20 mg total) by mouth daily. 90 tablet 1  . Omega-3 Fatty Acids (FISH OIL) 1200 MG CAPS Take 1 capsule by mouth  daily. (Patient not taking: Reported on 11/29/2020)     No facility-administered medications prior to visit.    No Known Allergies  Review of Systems  Cardiovascular: Negative for palpitations.  Musculoskeletal: Negative for joint pain.       Objective:    Physical Exam Constitutional:      General: He is not in acute distress.    Appearance: Normal appearance. He is not ill-appearing.  HENT:     Head: Normocephalic and atraumatic.     Right Ear: Tympanic membrane and external ear normal.     Left Ear: Tympanic membrane and external ear normal.  Eyes:     Extraocular Movements: Extraocular movements intact.     Pupils: Pupils are equal, round, and reactive to light.  Cardiovascular:     Rate and Rhythm: Normal rate and regular rhythm.     Pulses: Normal pulses.      Heart sounds: Normal heart sounds. No murmur heard. No gallop.   Pulmonary:     Effort: Pulmonary effort is normal. No respiratory distress.     Breath sounds: Normal breath sounds. No wheezing, rhonchi or rales.  Abdominal:     General: Bowel sounds are normal. There is no distension.     Palpations: Abdomen is soft. There is no mass.     Tenderness: There is no abdominal tenderness. There is no guarding or rebound.     Hernia: No hernia is present.  Skin:    General: Skin is warm and dry.  Neurological:     Mental Status: He is alert and oriented to person, place, and time.  Psychiatric:        Behavior: Behavior normal.     BP 138/88   Pulse 76   Temp 98.6 F (37 C) (Oral)   Resp 18   Ht 6\' 2"  (1.88 m)   Wt 270 lb 12.8 oz (122.8 kg)   SpO2 97%   BMI 34.77 kg/m  Wt Readings from Last 3 Encounters:  11/29/20 270 lb 12.8 oz (122.8 kg)  09/11/20 265 lb (120.2 kg)  10/08/19 270 lb 3.2 oz (122.6 kg)    Diabetic Foot Exam - Simple   No data filed    Lab Results  Component Value Date   WBC 6.2 10/08/2019   HGB 15.0 10/08/2019   HCT 43.8 10/08/2019   PLT 221.0 10/08/2019   GLUCOSE 81 04/18/2020   CHOL 195 04/18/2020   TRIG 101 04/18/2020   HDL 42 04/18/2020   LDLCALC 133 (H) 04/18/2020   ALT 18 04/18/2020   AST 15 04/18/2020   NA 138 04/18/2020   K 4.5 04/18/2020   CL 101 04/18/2020   CREATININE 1.07 04/18/2020   BUN 13 04/18/2020   CO2 30 04/18/2020   TSH 2.14 10/08/2019   PSA 0.60 10/08/2019    Lab Results  Component Value Date   TSH 2.14 10/08/2019   Lab Results  Component Value Date   WBC 6.2 10/08/2019   HGB 15.0 10/08/2019   HCT 43.8 10/08/2019   MCV 92.1 10/08/2019   PLT 221.0 10/08/2019   Lab Results  Component Value Date   NA 138 04/18/2020   K 4.5 04/18/2020   CO2 30 04/18/2020   GLUCOSE 81 04/18/2020   BUN 13 04/18/2020   CREATININE 1.07 04/18/2020   BILITOT 0.7 04/18/2020   ALKPHOS 59 01/12/2020   AST 15 04/18/2020   ALT 18  04/18/2020   PROT 6.7 04/18/2020   ALBUMIN 4.3 01/12/2020  CALCIUM 9.6 04/18/2020   ANIONGAP 11 09/22/2015   GFR 74.43 01/12/2020   Lab Results  Component Value Date   CHOL 195 04/18/2020   Lab Results  Component Value Date   HDL 42 04/18/2020   Lab Results  Component Value Date   LDLCALC 133 (H) 04/18/2020   Lab Results  Component Value Date   TRIG 101 04/18/2020   Lab Results  Component Value Date   CHOLHDL 4.6 04/18/2020   No results found for: HGBA1C  Colonoscopy- Not yet completed. PSA- Last completed 10/08/2019. Results showed 0.6. Repeat in 3 months.     Assessment & Plan:   Problem List Items Addressed This Visit      Unprioritized   Elevated BP without diagnosis of hypertension    Dash diet  Repeat was better  Recheck 2-3 weeks       Generalized anxiety disorder    Pt would like to try to decrease paxil paxil 10 mg  F/u prn      Relevant Medications   PARoxetine (PAXIL) 10 MG tablet   Preventative health care - Primary    Check labs  ghm utd See avs       Relevant Orders   Lipid panel   CBC with Differential/Platelet   TSH   Comprehensive metabolic panel   PSA    Other Visit Diagnoses    Anxiety       Relevant Medications   PARoxetine (PAXIL) 10 MG tablet       Meds ordered this encounter  Medications  . PARoxetine (PAXIL) 10 MG tablet    Sig: Take 1 tablet (10 mg total) by mouth daily.    Dispense:  90 tablet    Refill:  3    I, Dr. Seabron Spates, DO, personally preformed the services described in this documentation.  All medical record entries made by the scribe were at my direction and in my presence.  I have reviewed the chart and discharge instructions (if applicable) and agree that the record reflects my personal performance and is accurate and complete. 11/29/2020   I,[name],acting as a scribe for [name], DO.,have documented all relevant documentation on the behalf of [name],  DO,as directed by  [name], DO while in the presence of [name], DO.   [name], DO

## 2020-11-29 NOTE — Assessment & Plan Note (Signed)
Check labs  ghm utd See avs 

## 2020-11-29 NOTE — Patient Instructions (Addendum)
Preventive Care 40-42 Years Old, Male Preventive care refers to lifestyle choices and visits with your health care provider that can promote health and wellness. This includes:  A yearly physical exam. This is also called an annual wellness visit.  Regular dental and eye exams.  Immunizations.  Screening for certain conditions.  Healthy lifestyle choices, such as: ? Eating a healthy diet. ? Getting regular exercise. ? Not using drugs or products that contain nicotine and tobacco. ? Limiting alcohol use. What can I expect for my preventive care visit? Physical exam Your health care provider will check your:  Height and weight. These may be used to calculate your BMI (body mass index). BMI is a measurement that tells if you are at a healthy weight.  Heart rate and blood pressure.  Body temperature.  Skin for abnormal spots. Counseling Your health care provider may ask you questions about your:  Past medical problems.  Family's medical history.  Alcohol, tobacco, and drug use.  Emotional well-being.  Home life and relationship well-being.  Sexual activity.  Diet, exercise, and sleep habits.  Work and work environment.  Access to firearms. What immunizations do I need? Vaccines are usually given at various ages, according to a schedule. Your health care provider will recommend vaccines for you based on your age, medical history, and lifestyle or other factors, such as travel or where you work.   What tests do I need? Blood tests  Lipid and cholesterol levels. These may be checked every 5 years, or more often if you are over 50 years old.  Hepatitis C test.  Hepatitis B test. Screening  Lung cancer screening. You may have this screening every year starting at age 55 if you have a 30-pack-year history of smoking and currently smoke or have quit within the past 15 years.  Prostate cancer screening. Recommendations will vary depending on your family history and  other risks.  Genital exam to check for testicular cancer or hernias.  Colorectal cancer screening. ? All adults should have this screening starting at age 50 and continuing until age 75. ? Your health care provider may recommend screening at age 45 if you are at increased risk. ? You will have tests every 1-10 years, depending on your results and the type of screening test.  Diabetes screening. ? This is done by checking your blood sugar (glucose) after you have not eaten for a while (fasting). ? You may have this done every 1-3 years.  STD (sexually transmitted disease) testing, if you are at risk. Follow these instructions at home: Eating and drinking  Eat a diet that includes fresh fruits and vegetables, whole grains, lean protein, and low-fat dairy products.  Take vitamin and mineral supplements as recommended by your health care provider.  Do not drink alcohol if your health care provider tells you not to drink.  If you drink alcohol: ? Limit how much you have to 0-2 drinks a day. ? Be aware of how much alcohol is in your drink. In the [place] one drink equals one 12 oz bottle of beer (355 mL), one 5 oz glass of wine (148 mL), or one 1 oz glass of hard liquor (44 mL).   Lifestyle  Take daily care of your teeth and gums. Brush your teeth every morning and night with fluoride toothpaste. Floss one time each day.  Stay active. Exercise for at least 30 minutes 5 or more days each week.  Do not use any products that contain nicotine or   tobacco, such as cigarettes, e-cigarettes, and chewing tobacco. If you need help quitting, ask your health care provider.  Do not use drugs.  If you are sexually active, practice safe sex. Use a condom or other form of protection to prevent STIs (sexually transmitted infections).  If told by your health care provider, take low-dose aspirin daily starting at age 29.  Find healthy ways to cope with stress, such as: ? Meditation, yoga, or  listening to music. ? Journaling. ? Talking to a trusted person. ? Spending time with friends and family. Safety  Always wear your seat belt while driving or riding in a vehicle.  Do not drive: ? If you have been drinking alcohol. Do not ride with someone who has been drinking. ? When you are tired or distracted. ? While texting.  Wear a helmet and other protective equipment during sports activities.  If you have firearms in your house, make sure you follow all gun safety procedures. What's next?  Go to your health care provider once a year for an annual wellness visit.  Ask your health care provider how often you should have your eyes and teeth checked.  Stay up to date on all vaccines. This information is not intended to replace advice given to you by your health care provider. Make sure you discuss any questions you have with your health care provider. Document Revised: 03/17/2019 Document Reviewed: 06/12/2018 Elsevier Patient Education  2021 Elsevier Inc.  https://www.mata.com/.pdf">  DASH Eating Plan DASH stands for Dietary Approaches to Stop Hypertension. The DASH eating plan is a healthy eating plan that has been shown to: Reduce high blood pressure (hypertension). Reduce your risk for type 2 diabetes, heart disease, and stroke. Help with weight loss. What are tips for following this plan? Reading food labels Check food labels for the amount of salt (sodium) per serving. Choose foods with less than 5 percent of the Daily Value of sodium. Generally, foods with less than 300 milligrams (mg) of sodium per serving fit into this eating plan. To find whole grains, look for the word "whole" as the first word in the ingredient list. Shopping Buy products labeled as "low-sodium" or "no salt added." Buy fresh foods. Avoid canned foods and pre-made or frozen meals. Cooking Avoid adding salt when cooking. Use salt-free seasonings or herbs  instead of table salt or sea salt. Check with your health care provider or pharmacist before using salt substitutes. Do not fry foods. Cook foods using healthy methods such as baking, boiling, grilling, roasting, and broiling instead. Cook with heart-healthy oils, such as olive, canola, avocado, soybean, or sunflower oil. Meal planning Eat a balanced diet that includes: 4 or more servings of fruits and 4 or more servings of vegetables each day. Try to fill one-half of your plate with fruits and vegetables. 6-8 servings of whole grains each day. Less than 6 oz (170 g) of lean meat, poultry, or fish each day. A 3-oz (85-g) serving of meat is about the same size as a deck of cards. One egg equals 1 oz (28 g). 2-3 servings of low-fat dairy each day. One serving is 1 cup (237 mL). 1 serving of nuts, seeds, or beans 5 times each week. 2-3 servings of heart-healthy fats. Healthy fats called omega-3 fatty acids are found in foods such as walnuts, flaxseeds, fortified milks, and eggs. These fats are also found in cold-water fish, such as sardines, salmon, and mackerel. Limit how much you eat of: Canned or prepackaged foods. Food that  is high in trans fat, such as some fried foods. Food that is high in saturated fat, such as fatty meat. Desserts and other sweets, sugary drinks, and other foods with added sugar. Full-fat dairy products. Do not salt foods before eating. Do not eat more than 4 egg yolks a week. Try to eat at least 2 vegetarian meals a week. Eat more home-cooked food and less restaurant, buffet, and fast food.   Lifestyle When eating at a restaurant, ask that your food be prepared with less salt or no salt, if possible. If you drink alcohol: Limit how much you use to: 0-1 drink a day for women who are not pregnant. 0-2 drinks a day for men. Be aware of how much alcohol is in your drink. In the [place] one drink equals one 12 oz bottle of beer (355 mL), one 5 oz glass of wine (148 mL), or  one 1 oz glass of hard liquor (44 mL). General information Avoid eating more than 2,300 mg of salt a day. If you have hypertension, you may need to reduce your sodium intake to 1,500 mg a day. Work with your health care provider to maintain a healthy body weight or to lose weight. Ask what an ideal weight is for you. Get at least 30 minutes of exercise that causes your heart to beat faster (aerobic exercise) most days of the week. Activities may include walking, swimming, or biking. Work with your health care provider or dietitian to adjust your eating plan to your individual calorie needs. What foods should I eat? Fruits All fresh, dried, or frozen fruit. Canned fruit in natural juice (without added sugar). Vegetables Fresh or frozen vegetables (raw, steamed, roasted, or grilled). Low-sodium or reduced-sodium tomato and vegetable juice. Low-sodium or reduced-sodium tomato sauce and tomato paste. Low-sodium or reduced-sodium canned vegetables. Grains Whole-grain or whole-wheat bread. Whole-grain or whole-wheat pasta. Brown rice. Orpah Cobb. Bulgur. Whole-grain and low-sodium cereals. Pita bread. Low-fat, low-sodium crackers. Whole-wheat flour tortillas. Meats and other proteins Skinless chicken or [place]. Ground chicken or [place]. Pork with fat trimmed off. Fish and seafood. Egg whites. Dried beans, peas, or lentils. Unsalted nuts, nut butters, and seeds. Unsalted canned beans. Lean cuts of beef with fat trimmed off. Low-sodium, lean precooked or cured meat, such as sausages or meat loaves. Dairy Low-fat (1%) or fat-free (skim) milk. Reduced-fat, low-fat, or fat-free cheeses. Nonfat, low-sodium ricotta or cottage cheese. Low-fat or nonfat yogurt. Low-fat, low-sodium cheese. Fats and oils Soft margarine without trans fats. Vegetable oil. Reduced-fat, low-fat, or light mayonnaise and salad dressings (reduced-sodium). Canola, safflower, olive, avocado, soybean, and sunflower oils.  Avocado. Seasonings and condiments Herbs. Spices. Seasoning mixes without salt. Other foods Unsalted popcorn and pretzels. Fat-free sweets. The items listed above may not be a complete list of foods and beverages you can eat. Contact a dietitian for more information. What foods should I avoid? Fruits Canned fruit in a light or heavy syrup. Fried fruit. Fruit in cream or butter sauce. Vegetables Creamed or fried vegetables. Vegetables in a cheese sauce. Regular canned vegetables (not low-sodium or reduced-sodium). Regular canned tomato sauce and paste (not low-sodium or reduced-sodium). Regular tomato and vegetable juice (not low-sodium or reduced-sodium). Rosita Fire. Olives. Grains Baked goods made with fat, such as croissants, muffins, or some breads. Dry pasta or rice meal packs. Meats and other proteins Fatty cuts of meat. Ribs. Fried meat. Tomasa Blase. Bologna, salami, and other precooked or cured meats, such as sausages or meat loaves. Fat from the back of a  pig (fatback). Bratwurst. Salted nuts and seeds. Canned beans with added salt. Canned or smoked fish. Whole eggs or egg yolks. Chicken or [place] with skin. Dairy Whole or 2% milk, cream, and half-and-half. Whole or full-fat cream cheese. Whole-fat or sweetened yogurt. Full-fat cheese. Nondairy creamers. Whipped toppings. Processed cheese and cheese spreads. Fats and oils Butter. Stick margarine. Lard. Shortening. Ghee. Bacon fat. Tropical oils, such as coconut, palm kernel, or palm oil. Seasonings and condiments Onion salt, garlic salt, seasoned salt, table salt, and sea salt. Worcestershire sauce. Tartar sauce. Barbecue sauce. Teriyaki sauce. Soy sauce, including reduced-sodium. Steak sauce. Canned and packaged gravies. Fish sauce. Oyster sauce. Cocktail sauce. Store-bought horseradish. Ketchup. Mustard. Meat flavorings and tenderizers. Bouillon cubes. Hot sauces. Pre-made or packaged marinades. Pre-made or packaged taco seasonings. Relishes.  Regular salad dressings. Other foods Salted popcorn and pretzels. The items listed above may not be a complete list of foods and beverages you should avoid. Contact a dietitian for more information. Where to find more information National Heart, Lung, and Blood Institute: PopSteam.is American Heart Association: www.heart.org Academy of Nutrition and Dietetics: www.eatright.org National Kidney Foundation: www.kidney.org Summary The DASH eating plan is a healthy eating plan that has been shown to reduce high blood pressure (hypertension). It may also reduce your risk for type 2 diabetes, heart disease, and stroke. When on the DASH eating plan, aim to eat more fresh fruits and vegetables, whole grains, lean proteins, low-fat dairy, and heart-healthy fats. With the DASH eating plan, you should limit salt (sodium) intake to 2,300 mg a day. If you have hypertension, you may need to reduce your sodium intake to 1,500 mg a day. Work with your health care provider or dietitian to adjust your eating plan to your individual calorie needs. This information is not intended to replace advice given to you by your health care provider. Make sure you discuss any questions you have with your health care provider. Document Revised: 05/22/2019 Document Reviewed: 05/22/2019 Elsevier Patient Education  2021 [employer].

## 2020-11-29 NOTE — Progress Notes (Signed)
Subjective:   By signing my name below, I, Levi Berger, attest that this documentation has been prepared under the direction and in the presence of Dr. Seabron Spates, DO. 11/29/2020     Patient ID: Levi Berger, male    DOB: 1980/09/22, 40 y.o.   MRN: [id number]  Chief Complaint  Patient presents with  . Annual Exam    Pt states fasting     HPI Patient is in today for a comprehensive physical exam.  He denies having any fever, ear pain, congestion, sinus pain, sore throat, eye pain, chest pain, palpations, cough, SOB, wheezing, n/v/d, constipation, blood in stool, dysuria, frequency, hematuria, dizziness, or headaches at this time.    Past Medical History:  Diagnosis Date  . Generalized anxiety disorder 12/26/2015  . Palpitations     Past Surgical History:  Procedure Laterality Date  . None    . TRANSTHORACIC ECHOCARDIOGRAM  01/2012   Normal LV size and thickness. Normal wall motion with EF of 55-60%. Possible grade 2 diastolic dysfunction. Otherwise normal valve function.    Family History  Problem Relation Age of Onset  . Lung cancer Father        Smoker  . Hypertension Father   . Diabetes Father   . Arthritis Paternal Grandfather   . Colon cancer Paternal Grandfather   . Diabetes Paternal Grandfather   . Heart disease Maternal Grandfather   . Alcohol abuse Maternal Grandfather   . Atrial fibrillation Mother     Social History   Socioeconomic History  . Marital status: Single    Spouse name: Not on file  . Number of children: 0  . Years of education: Not on file  . Highest education level: Not on file  Occupational History  . Occupation: [employer]    Employer: UNEMPLOYED  Tobacco Use  . Smoking status: Passive Smoke Exposure - Never Smoker  . Smokeless tobacco: Former Neurosurgeon  . Tobacco comment: pt occ smokes a pipe maybe once or twice a month  Vaping Use  . Vaping Use: Never used  Substance and Sexual Activity  . Alcohol use: Yes     Alcohol/week: 5.0 standard drinks    Types: 5 Standard drinks or equivalent per week    Comment: ocassionally  . Drug use: No  . Sexual activity: Not on file  Other Topics Concern  . Not on file  Social History Narrative  . Not on file   Social Determinants of Health   Financial Resource Strain: Not on file  Food Insecurity: Not on file  Transportation Needs: Not on file  Physical Activity: Not on file  Stress: Not on file  Social Connections: Not on file  Intimate Partner Violence: Not on file    Outpatient Medications Prior to Visit  Medication Sig Dispense Refill  . ibuprofen (ADVIL,MOTRIN) 800 MG tablet Take 1 tablet (800 mg total) by mouth every 8 (eight) hours as needed. 30 tablet 3  . Krill Oil 500 MG CAPS Take by mouth.    Marland Kitchen PARoxetine (PAXIL) 20 MG tablet Take 1 tablet (20 mg total) by mouth daily. 90 tablet 1  . Omega-3 Fatty Acids (FISH OIL) 1200 MG CAPS Take 1 capsule by mouth daily. (Patient not taking: Reported on 11/29/2020)     No facility-administered medications prior to visit.   Health Maintenance  Topic Date Due  . COVID-19 Vaccine (3 - Booster for Pfizer series) 12/15/2020 (Originally 02/29/2020)  . INFLUENZA VACCINE  01/30/2021  . Zoster Vaccines-  Shingrix (1 of 2) 09/09/2028  . TETANUS/TDAP  09/12/2030  . Hepatitis C Screening  Completed  . HIV Screening  Completed  . HPV VACCINES  Aged Out    No Known Allergies  Review of Systems  Constitutional: Negative for fever and malaise/fatigue.  HENT: Negative for congestion, ear pain, sinus pain and sore throat.   Eyes: Negative for blurred vision and pain.  Respiratory: Negative for cough, shortness of breath and wheezing.   Cardiovascular: Negative for chest pain, palpitations and leg swelling.  Gastrointestinal: Negative for abdominal pain, blood in stool, constipation, diarrhea, nausea and vomiting.  Genitourinary: Negative for dysuria, frequency and hematuria.  Musculoskeletal: Negative for falls.   Skin: Negative for rash.  Neurological: Negative for dizziness, loss of consciousness and headaches.  Endo/Heme/Allergies: Negative for environmental allergies.  Psychiatric/Behavioral: Negative for depression. The patient is not nervous/anxious.        Objective:    Physical Exam Vitals and nursing note reviewed.  Constitutional:      General: He is not in acute distress.    Appearance: Normal appearance. He is well-developed. He is not ill-appearing or diaphoretic.  HENT:     Head: Normocephalic and atraumatic.     Right Ear: External ear normal.     Left Ear: External ear normal.     Nose: Nose normal.     Mouth/Throat:     Pharynx: No oropharyngeal exudate.  Eyes:     General:        Right eye: No discharge.        Left eye: No discharge.     Extraocular Movements: Extraocular movements intact.     Conjunctiva/sclera: Conjunctivae normal.     Pupils: Pupils are equal, round, and reactive to light.  Neck:     Thyroid: No thyromegaly.     Vascular: No JVD.  Cardiovascular:     Rate and Rhythm: Normal rate and regular rhythm.     Pulses: Normal pulses.     Heart sounds: Normal heart sounds. No murmur heard. No friction rub. No gallop.   Pulmonary:     Effort: Pulmonary effort is normal. No respiratory distress.     Breath sounds: Normal breath sounds. No wheezing, rhonchi or rales.  Chest:     Chest wall: No tenderness.  Abdominal:     General: Bowel sounds are normal. There is no distension.     Palpations: Abdomen is soft. There is no mass.     Tenderness: There is no abdominal tenderness. There is no guarding or rebound.  Genitourinary:    Comments: Pt preferred not to have gu exam done psa in blood only Musculoskeletal:        General: No tenderness. Normal range of motion.     Cervical back: Normal range of motion and neck supple.  Lymphadenopathy:     Cervical: No cervical adenopathy.  Skin:    General: Skin is warm and dry.     Coloration: Skin is not  pale.     Findings: No erythema or rash.  Neurological:     General: No focal deficit present.     Mental Status: He is alert and oriented to person, place, and time.     Motor: No abnormal muscle tone.     Gait: Gait normal.     Deep Tendon Reflexes: Reflexes are normal and symmetric. Reflexes normal.  Psychiatric:        Behavior: Behavior normal.        Thought  Content: Thought content normal.        Judgment: Judgment normal.     BP 138/88   Pulse 76   Temp 98.6 F (37 C) (Oral)   Resp 18   Ht 6\' 2"  (1.88 m)   Wt 270 lb 12.8 oz (122.8 kg)   SpO2 97%   BMI 34.77 kg/m  Wt Readings from Last 3 Encounters:  11/29/20 270 lb 12.8 oz (122.8 kg)  09/11/20 265 lb (120.2 kg)  10/08/19 270 lb 3.2 oz (122.6 kg)    Diabetic Foot Exam - Simple   No data filed    Lab Results  Component Value Date   WBC 6.2 10/08/2019   HGB 15.0 10/08/2019   HCT 43.8 10/08/2019   PLT 221.0 10/08/2019   GLUCOSE 81 04/18/2020   CHOL 195 04/18/2020   TRIG 101 04/18/2020   HDL 42 04/18/2020   LDLCALC 133 (H) 04/18/2020   ALT 18 04/18/2020   AST 15 04/18/2020   NA 138 04/18/2020   K 4.5 04/18/2020   CL 101 04/18/2020   CREATININE 1.07 04/18/2020   BUN 13 04/18/2020   CO2 30 04/18/2020   TSH 2.14 10/08/2019   PSA 0.60 10/08/2019    Lab Results  Component Value Date   TSH 2.14 10/08/2019   Lab Results  Component Value Date   WBC 6.2 10/08/2019   HGB 15.0 10/08/2019   HCT 43.8 10/08/2019   MCV 92.1 10/08/2019   PLT 221.0 10/08/2019   Lab Results  Component Value Date   NA 138 04/18/2020   K 4.5 04/18/2020   CO2 30 04/18/2020   GLUCOSE 81 04/18/2020   BUN 13 04/18/2020   CREATININE 1.07 04/18/2020   BILITOT 0.7 04/18/2020   ALKPHOS 59 01/12/2020   AST 15 04/18/2020   ALT 18 04/18/2020   PROT 6.7 04/18/2020   ALBUMIN 4.3 01/12/2020   CALCIUM 9.6 04/18/2020   ANIONGAP 11 09/22/2015   GFR 74.43 01/12/2020   Lab Results  Component Value Date   CHOL 195 04/18/2020    Lab Results  Component Value Date   HDL 42 04/18/2020   Lab Results  Component Value Date   LDLCALC 133 (H) 04/18/2020   Lab Results  Component Value Date   TRIG 101 04/18/2020   Lab Results  Component Value Date   CHOLHDL 4.6 04/18/2020   No results found for: HGBA1C  Colonoscopy- Not yet completed.-- not due  PSA- Last completed 10/08/2019. Results showed 0.6. Repeat in 3 months.     Assessment & Plan:   Problem List Items Addressed This Visit      Unprioritized   Elevated BP without diagnosis of hypertension    Dash diet  Repeat was better  Recheck 2-3 weeks       Generalized anxiety disorder    Pt would like to try to decrease paxil paxil 10 mg  F/u prn      Relevant Medications   PARoxetine (PAXIL) 10 MG tablet   Preventative health care - Primary    Check labs  ghm utd See avs       Relevant Orders   Lipid panel   CBC with Differential/Platelet   TSH   Comprehensive metabolic panel   PSA    Other Visit Diagnoses    Anxiety       Relevant Medications   PARoxetine (PAXIL) 10 MG tablet       Meds ordered this encounter  Medications  . PARoxetine (PAXIL) 10  MG tablet    Sig: Take 1 tablet (10 mg total) by mouth daily.    Dispense:  90 tablet    Refill:  3    I, Dr. Seabron Spates, DO, personally preformed the services described in this documentation.  All medical record entries made by the scribe were at my direction and in my presence.  I have reviewed the chart and discharge instructions (if applicable) and agree that the record reflects my personal performance and is accurate and complete. 11/29/2020   I,Levi Berger,acting as a scribe for [name], DO.,have documented all relevant documentation on the behalf of [name], DO,as directed by  [name], DO while in the presence of [name], DO.   [name], DO

## 2020-11-29 NOTE — Assessment & Plan Note (Signed)
Dash diet  Repeat was better  Recheck 2-3 weeks

## 2020-11-29 NOTE — Assessment & Plan Note (Signed)
Pt would like to try to decrease paxil paxil 10 mg  F/u prn

## 2020-12-19 ENCOUNTER — Encounter: Payer: Self-pay | Admitting: Family Medicine

## 2020-12-19 ENCOUNTER — Ambulatory Visit: Payer: 59 | Admitting: Family Medicine

## 2020-12-19 ENCOUNTER — Other Ambulatory Visit: Payer: Self-pay

## 2020-12-19 DIAGNOSIS — R030 Elevated blood-pressure reading, without diagnosis of hypertension: Secondary | ICD-10-CM | POA: Diagnosis not present

## 2020-12-19 NOTE — Progress Notes (Signed)
Patient ID: Levi Berger, male    DOB: 12/04/1980  Age: 40 y.o. MRN: [id number]    Subjective:  Subjective  HPI Levi Berger presents for an office visit today. He reports feeling well and has no complaints today.  He notes that his BP is "good".  BP Readings from Last 3 Encounters:  12/19/20 110/88  11/29/20 138/88  09/11/20 (!) 164/107  He states to offset his sodium intake, he has been consuming more potasium and drinking more water. He denies any chest pain, SOB, fever, abdominal pain, cough, chills, sore throat, dysuria, urinary incontinence, back pain, HA, or N/V/D at this time.   Review of Systems  Constitutional:  Negative for chills, fatigue and fever.  HENT:  Negative for ear pain, rhinorrhea, sinus pressure, sinus pain, sore throat and tinnitus.   Eyes:  Negative for pain.  Respiratory:  Negative for cough, shortness of breath and wheezing.   Cardiovascular:  Negative for chest pain.  Gastrointestinal:  Negative for abdominal pain, anal bleeding, constipation, diarrhea, nausea and vomiting.  Genitourinary:  Negative for flank pain.  Musculoskeletal:  Negative for back pain and neck pain.  Skin:  Negative for rash.  Neurological:  Negative for seizures, weakness, light-headedness, numbness and headaches.   History Past Medical History:  Diagnosis Date   Generalized anxiety disorder 12/26/2015   Palpitations     He has a past surgical history that includes None and transthoracic echocardiogram (01/2012).   His family history includes Alcohol abuse in his maternal grandfather; Arthritis in his paternal grandfather; Atrial fibrillation in his mother; Colon cancer in his paternal grandfather; Diabetes in his father and paternal grandfather; Heart disease in his maternal grandfather; Hypertension in his father; Lung cancer in his father.He reports that he has never smoked. He has been exposed to tobacco smoke. He has quit using smokeless tobacco. He reports current  alcohol use of about 5.0 standard drinks of alcohol per week. He reports that he does not use drugs.  Current Outpatient Medications on File Prior to Visit  Medication Sig Dispense Refill   ibuprofen (ADVIL,MOTRIN) 800 MG tablet Take 1 tablet (800 mg total) by mouth every 8 (eight) hours as needed. 30 tablet 3   Krill Oil 500 MG CAPS Take by mouth.     PARoxetine (PAXIL) 10 MG tablet Take 1 tablet (10 mg total) by mouth daily. 90 tablet 3   No current facility-administered medications on file prior to visit.     Objective:  Objective  Physical Exam Vitals and nursing note reviewed.  Constitutional:      General: He is not in acute distress.    Appearance: Normal appearance. He is well-developed. He is not ill-appearing.  Cardiovascular:     Rate and Rhythm: Normal rate and regular rhythm.     Pulses: Normal pulses.     Heart sounds: Normal heart sounds. No murmur heard.   No friction rub. No gallop.  Pulmonary:     Effort: Pulmonary effort is normal. No respiratory distress.     Breath sounds: Normal breath sounds. No stridor. No wheezing, rhonchi or rales.  Chest:     Chest wall: No tenderness.  Skin:    General: Skin is warm and dry.  Neurological:     Mental Status: He is alert and oriented to person, place, and time.  Psychiatric:        Behavior: Behavior normal.        Thought Content: Thought content normal.   BP 110/88 (  BP Location: Right Arm, Patient Position: Sitting, Cuff Size: Large)   Pulse 84   Temp 98.9 F (37.2 C) (Oral)   Resp 18   Ht 6\' 2"  (1.88 m)   Wt 266 lb 9.6 oz (120.9 kg)   SpO2 97%   BMI 34.23 kg/m  Wt Readings from Last 3 Encounters:  12/19/20 266 lb 9.6 oz (120.9 kg)  11/29/20 270 lb 12.8 oz (122.8 kg)  09/11/20 265 lb (120.2 kg)     Lab Results  Component Value Date   WBC 5.7 11/29/2020   HGB 15.3 11/29/2020   HCT 45.2 11/29/2020   PLT 197.0 11/29/2020   GLUCOSE 92 11/29/2020   CHOL 192 11/29/2020   TRIG 133.0 11/29/2020   HDL  40.30 11/29/2020   LDLCALC 125 (H) 11/29/2020   ALT 17 11/29/2020   AST 13 11/29/2020   NA 139 11/29/2020   K 4.5 11/29/2020   CL 104 11/29/2020   CREATININE 1.02 11/29/2020   BUN 17 11/29/2020   CO2 30 11/29/2020   TSH 1.89 11/29/2020   PSA 1.76 11/29/2020    No results found.   Assessment & Plan:  Plan  No orders of the defined types were placed in this encounter.   Problem List Items Addressed This Visit   None  Follow-up: Return in about 1 year (around 12/19/2021), or if symptoms worsen or fail to improve, for annual exam, fasting.   I,Gordon Zheng,acting as a 12/21/2021 for Neurosurgeon, DO.,have documented all relevant documentation on the behalf of [employer], DO,as directed by  [name], DO while in the presence of [name], DO.  I, [name], DO, have reviewed all documentation for this visit. The documentation on 12/19/20 for the exam, diagnosis, procedures, and orders are all accurate and complete.

## 2020-12-19 NOTE — Assessment & Plan Note (Signed)
Good today con't dash diet  F/u for cpe 1 year

## 2020-12-19 NOTE — Patient Instructions (Signed)
https://www.nhlbi.nih.gov/files/docs/public/heart/dash_brief.pdf">  DASH Eating Plan DASH stands for Dietary Approaches to Stop Hypertension. The DASH eating plan is a healthy eating plan that has been shown to: Reduce high blood pressure (hypertension). Reduce your risk for type 2 diabetes, heart disease, and stroke. Help with weight loss. What are tips for following this plan? Reading food labels Check food labels for the amount of salt (sodium) per serving. Choose foods with less than 5 percent of the Daily Value of sodium. Generally, foods with less than 300 milligrams (mg) of sodium per serving fit into this eating plan. To find whole grains, look for the word "whole" as the first word in the ingredient list. Shopping Buy products labeled as "low-sodium" or "no salt added." Buy fresh foods. Avoid canned foods and pre-made or frozen meals. Cooking Avoid adding salt when cooking. Use salt-free seasonings or herbs instead of table salt or sea salt. Check with your health care provider or pharmacist before using salt substitutes. Do not fry foods. Cook foods using healthy methods such as baking, boiling, grilling, roasting, and broiling instead. Cook with heart-healthy oils, such as olive, canola, avocado, soybean, or sunflower oil. Meal planning  Eat a balanced diet that includes: 4 or more servings of fruits and 4 or more servings of vegetables each day. Try to fill one-half of your plate with fruits and vegetables. 6-8 servings of whole grains each day. Less than 6 oz (170 g) of lean meat, poultry, or fish each day. A 3-oz (85-g) serving of meat is about the same size as a deck of cards. One egg equals 1 oz (28 g). 2-3 servings of low-fat dairy each day. One serving is 1 cup (237 mL). 1 serving of nuts, seeds, or beans 5 times each week. 2-3 servings of heart-healthy fats. Healthy fats called omega-3 fatty acids are found in foods such as walnuts, flaxseeds, fortified milks, and eggs.  These fats are also found in cold-water fish, such as sardines, salmon, and mackerel. Limit how much you eat of: Canned or prepackaged foods. Food that is high in trans fat, such as some fried foods. Food that is high in saturated fat, such as fatty meat. Desserts and other sweets, sugary drinks, and other foods with added sugar. Full-fat dairy products. Do not salt foods before eating. Do not eat more than 4 egg yolks a week. Try to eat at least 2 vegetarian meals a week. Eat more home-cooked food and less restaurant, buffet, and fast food.  Lifestyle When eating at a restaurant, ask that your food be prepared with less salt or no salt, if possible. If you drink alcohol: Limit how much you use to: 0-1 drink a day for women who are not pregnant. 0-2 drinks a day for men. Be aware of how much alcohol is in your drink. In the [place] one drink equals one 12 oz bottle of beer (355 mL), one 5 oz glass of wine (148 mL), or one 1 oz glass of hard liquor (44 mL). General information Avoid eating more than 2,300 mg of salt a day. If you have hypertension, you may need to reduce your sodium intake to 1,500 mg a day. Work with your health care provider to maintain a healthy body weight or to lose weight. Ask what an ideal weight is for you. Get at least 30 minutes of exercise that causes your heart to beat faster (aerobic exercise) most days of the week. Activities may include walking, swimming, or biking. Work with your health care provider   or dietitian to adjust your eating plan to your individual calorie needs. What foods should I eat? Fruits All fresh, dried, or frozen fruit. Canned fruit in natural juice (without addedsugar). Vegetables Fresh or frozen vegetables (raw, steamed, roasted, or grilled). Low-sodium or reduced-sodium tomato and vegetable juice. Low-sodium or reduced-sodium tomatosauce and tomato paste. Low-sodium or reduced-sodium canned vegetables. Grains Whole-grain or  whole-wheat bread. Whole-grain or whole-wheat pasta. Brown rice. Oatmeal. Quinoa. Bulgur. Whole-grain and low-sodium cereals. Pita bread.Low-fat, low-sodium crackers. Whole-wheat flour tortillas. Meats and other proteins Skinless chicken or turkey. Ground chicken or turkey. Pork with fat trimmed off. Fish and seafood. Egg whites. Dried beans, peas, or lentils. Unsalted nuts, nut butters, and seeds. Unsalted canned beans. Lean cuts of beef with fat trimmed off. Low-sodium, lean precooked or cured meat, such as sausages or meatloaves. Dairy Low-fat (1%) or fat-free (skim) milk. Reduced-fat, low-fat, or fat-free cheeses. Nonfat, low-sodium ricotta or cottage cheese. Low-fat or nonfatyogurt. Low-fat, low-sodium cheese. Fats and oils Soft margarine without trans fats. Vegetable oil. Reduced-fat, low-fat, or light mayonnaise and salad dressings (reduced-sodium). Canola, safflower, olive, avocado, soybean, andsunflower oils. Avocado. Seasonings and condiments Herbs. Spices. Seasoning mixes without salt. Other foods Unsalted popcorn and pretzels. Fat-free sweets. The items listed above may not be a complete list of foods and beverages you can eat. Contact a dietitian for more information. What foods should I avoid? Fruits Canned fruit in a light or heavy syrup. Fried fruit. Fruit in cream or buttersauce. Vegetables Creamed or fried vegetables. Vegetables in a cheese sauce. Regular canned vegetables (not low-sodium or reduced-sodium). Regular canned tomato sauce and paste (not low-sodium or reduced-sodium). Regular tomato and vegetable juice(not low-sodium or reduced-sodium). Pickles. Olives. Grains Baked goods made with fat, such as croissants, muffins, or some breads. Drypasta or rice meal packs. Meats and other proteins Fatty cuts of meat. Ribs. Fried meat. Bacon. Bologna, salami, and other precooked or cured meats, such as sausages or meat loaves. Fat from the back of a pig (fatback). Bratwurst.  Salted nuts and seeds. Canned beans with added salt. Canned orsmoked fish. Whole eggs or egg yolks. Chicken or turkey with skin. Dairy Whole or 2% milk, cream, and half-and-half. Whole or full-fat cream cheese. Whole-fat or sweetened yogurt. Full-fat cheese. Nondairy creamers. Whippedtoppings. Processed cheese and cheese spreads. Fats and oils Butter. Stick margarine. Lard. Shortening. Ghee. Bacon fat. Tropical oils, suchas coconut, palm kernel, or palm oil. Seasonings and condiments Onion salt, garlic salt, seasoned salt, table salt, and sea salt. Worcestershire sauce. Tartar sauce. Barbecue sauce. Teriyaki sauce. Soy sauce, including reduced-sodium. Steak sauce. Canned and packaged gravies. Fish sauce. Oyster sauce. Cocktail sauce. Store-bought horseradish. Ketchup. Mustard. Meat flavorings and tenderizers. Bouillon cubes. Hot sauces. Pre-made or packaged marinades. Pre-made or packaged taco seasonings. Relishes. Regular saladdressings. Other foods Salted popcorn and pretzels. The items listed above may not be a complete list of foods and beverages you should avoid. Contact a dietitian for more information. Where to find more information National Heart, Lung, and Blood Institute: www.nhlbi.nih.gov American Heart Association: www.heart.org Academy of Nutrition and Dietetics: www.eatright.org National Kidney Foundation: www.kidney.org Summary The DASH eating plan is a healthy eating plan that has been shown to reduce high blood pressure (hypertension). It may also reduce your risk for type 2 diabetes, heart disease, and stroke. When on the DASH eating plan, aim to eat more fresh fruits and vegetables, whole grains, lean proteins, low-fat dairy, and heart-healthy fats. With the DASH eating plan, you should limit salt (sodium) intake to 2,300   mg a day. If you have hypertension, you may need to reduce your sodium intake to 1,500 mg a day. Work with your health care provider or dietitian to adjust  your eating plan to your individual calorie needs. This information is not intended to replace advice given to you by your health care provider. Make sure you discuss any questions you have with your healthcare provider. Document Revised: 05/22/2019 Document Reviewed: 05/22/2019 Elsevier Patient Education  2022 Elsevier Inc.  

## 2021-02-14 ENCOUNTER — Other Ambulatory Visit: Payer: Self-pay

## 2021-02-14 DIAGNOSIS — F419 Anxiety disorder, unspecified: Secondary | ICD-10-CM

## 2021-02-14 MED ORDER — PAROXETINE HCL 10 MG PO TABS: 10.0000 mg | ORAL_TABLET | Freq: Every day | ORAL | 3 refills | Status: DC

## 2021-11-08 ENCOUNTER — Other Ambulatory Visit: Payer: Self-pay | Admitting: Family Medicine

## 2021-11-08 DIAGNOSIS — F419 Anxiety disorder, unspecified: Secondary | ICD-10-CM

## 2021-12-19 ENCOUNTER — Encounter: Payer: 59 | Admitting: Family Medicine

## 2021-12-25 ENCOUNTER — Encounter: Payer: 59 | Admitting: Family Medicine

## 2022-01-24 ENCOUNTER — Other Ambulatory Visit: Payer: Self-pay | Admitting: Family Medicine

## 2022-01-24 DIAGNOSIS — F419 Anxiety disorder, unspecified: Secondary | ICD-10-CM

## 2022-01-25 ENCOUNTER — Encounter: Payer: Self-pay | Admitting: Family Medicine

## 2022-01-25 ENCOUNTER — Ambulatory Visit (INDEPENDENT_AMBULATORY_CARE_PROVIDER_SITE_OTHER): Payer: 59 | Admitting: Family Medicine

## 2022-01-25 VITALS — BP 142/100 | HR 86 | Temp 98.7°F | Resp 18 | Ht 74.0 in | Wt 287.6 lb

## 2022-01-25 DIAGNOSIS — R030 Elevated blood-pressure reading, without diagnosis of hypertension: Secondary | ICD-10-CM | POA: Diagnosis not present

## 2022-01-25 DIAGNOSIS — Z0000 Encounter for general adult medical examination without abnormal findings: Secondary | ICD-10-CM

## 2022-01-25 DIAGNOSIS — M7711 Lateral epicondylitis, right elbow: Secondary | ICD-10-CM | POA: Diagnosis not present

## 2022-01-25 NOTE — Progress Notes (Signed)
Subjective:   By signing my name below, I, [name], attest that this documentation has been prepared under the direction and in the presence of Levi Schultz, DO  01/25/2022   Patient ID: Levi Berger, male    DOB: 02-15-1981, 41 y.o.   MRN: [id number]  Chief Complaint  Patient presents with   Annual Exam    Pt states fasting     HPI Patient is in today for comprehensive physical exam.  At home he does not check his blood pressure regularly. His anxiety has improved since the the last visit, and he is interested in taking a smaller dose of his anxiety medication. He sees his dentist regularly.   His knees have become more "crackly" from activity at work, but adds that they do not cause him pain at this time. He has developed pain in his right elbow, and is unsure if he has tennis elbow or if it is tendon pain.   He denies having any fever, new moles, congestion, sinus pain, sore throat, chest pain, palpitations, cough, shortness of breath, wheezing, nausea, vomiting, diarrhea, constipation, dysuria, frequency, abdominal pain, hematuria, headaches.  No changes in family history or surgical operations.    Past Medical History:  Diagnosis Date   Generalized anxiety disorder 12/26/2015   Palpitations     Past Surgical History:  Procedure Laterality Date   None     TRANSTHORACIC ECHOCARDIOGRAM  01/2012   Normal LV size and thickness. Normal wall motion with EF of 55-60%. Possible grade 2 diastolic dysfunction. Otherwise normal valve function.    Family History  Problem Relation Age of Onset   Lung cancer Father        Smoker   Hypertension Father    Diabetes Father    Arthritis Paternal Grandfather    Colon cancer Paternal Grandfather    Diabetes Paternal Grandfather    Heart disease Maternal Grandfather    Alcohol abuse Maternal Grandfather    Atrial fibrillation Mother     Social History   Socioeconomic History   Marital status: Single     Spouse name: Not on file   Number of children: 0   Years of education: Not on file   Highest education level: Not on file  Occupational History   Occupation: [employer]    Employer: UNEMPLOYED  Tobacco Use   Smoking status: Never    Passive exposure: Yes   Smokeless tobacco: Former   Tobacco comments:    pt occ smokes a pipe maybe once or twice a month  Vaping Use   Vaping Use: Never used  Substance and Sexual Activity   Alcohol use: Yes    Alcohol/week: 5.0 standard drinks of alcohol    Types: 5 Standard drinks or equivalent per week    Comment: ocassionally   Drug use: No   Sexual activity: Not on file  Other Topics Concern   Not on file  Social History Narrative   Not on file   Social Determinants of Health   Financial Resource Strain: Not on file  Food Insecurity: Not on file  Transportation Needs: Not on file  Physical Activity: Not on file  Stress: Not on file  Social Connections: Not on file  Intimate Partner Violence: Not on file    Outpatient Medications Prior to Visit  Medication Sig Dispense Refill   ibuprofen (ADVIL,MOTRIN) 800 MG tablet Take 1 tablet (800 mg total) by mouth every 8 (eight) hours as needed. 30 tablet 3  Krill Oil 500 MG CAPS Take by mouth.     PARoxetine (PAXIL) 10 MG tablet Take 1 tablet by mouth daily. 90 tablet 0   No facility-administered medications prior to visit.    No Known Allergies  Review of Systems  Constitutional:  Negative for fever and malaise/fatigue.  HENT:  Negative for congestion.   Eyes:  Negative for blurred vision.  Respiratory:  Negative for cough and shortness of breath.   Cardiovascular:  Negative for chest pain, palpitations and leg swelling.  Gastrointestinal:  Negative for vomiting.  Musculoskeletal:  Positive for joint pain (in his right elbow). Negative for back pain.  Skin:  Negative for rash.       (-) new moles  Neurological:  Negative for loss of consciousness and headaches.        Objective:    Physical Exam Vitals and nursing note reviewed.  Constitutional:      Appearance: Normal appearance. He is well-developed. He is not ill-appearing.  HENT:     Head: Normocephalic and atraumatic.     Right Ear: External ear normal.     Left Ear: External ear normal.  Eyes:     Extraocular Movements: Extraocular movements intact.     Pupils: Pupils are equal, round, and reactive to light.  Neck:     Thyroid: No thyromegaly.  Cardiovascular:     Rate and Rhythm: Normal rate and regular rhythm.     Heart sounds: No murmur heard.    No gallop.  Pulmonary:     Effort: Pulmonary effort is normal. No respiratory distress.     Breath sounds: Normal breath sounds. No wheezing or rales.  Chest:     Chest wall: No tenderness.  Abdominal:     General: Bowel sounds are normal. There is no distension.     Palpations: Abdomen is soft.     Tenderness: There is no abdominal tenderness. There is no guarding.  Musculoskeletal:        General: No tenderness.     Cervical back: Normal range of motion and neck supple.     Comments: (+) Discomfort in right elbow  Skin:    General: Skin is warm and dry.  Neurological:     Mental Status: He is alert and oriented to person, place, and time.  Psychiatric:        Behavior: Behavior normal.        Thought Content: Thought content normal.        Judgment: Judgment normal.     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%   BMI 36.93 kg/m  Wt Readings from Last 3 Encounters:  01/25/22 287 lb 9.6 oz (130.5 kg)  12/19/20 266 lb 9.6 oz (120.9 kg)  11/29/20 270 lb 12.8 oz (122.8 kg)    Diabetic Foot Exam - Simple   No data filed    Lab Results  Component Value Date   WBC 5.7 11/29/2020   HGB 15.3 11/29/2020   HCT 45.2 11/29/2020   PLT 197.0 11/29/2020   GLUCOSE 92 11/29/2020   CHOL 192 11/29/2020   TRIG  133.0 11/29/2020   HDL 40.30 11/29/2020   LDLCALC 125 (H) 11/29/2020   ALT 17 11/29/2020   AST 13 11/29/2020   NA 139 11/29/2020  K 4.5 11/29/2020   CL 104 11/29/2020   CREATININE 1.02 11/29/2020   BUN 17 11/29/2020   CO2 30 11/29/2020   TSH 1.89 11/29/2020   PSA 1.76 11/29/2020    Lab Results  Component Value Date   TSH 1.89 11/29/2020   Lab Results  Component Value Date   WBC 5.7 11/29/2020   HGB 15.3 11/29/2020   HCT 45.2 11/29/2020   MCV 91.9 11/29/2020   PLT 197.0 11/29/2020   Lab Results  Component Value Date   NA 139 11/29/2020   K 4.5 11/29/2020   CO2 30 11/29/2020   GLUCOSE 92 11/29/2020   BUN 17 11/29/2020   CREATININE 1.02 11/29/2020   BILITOT 0.7 11/29/2020   ALKPHOS 60 11/29/2020   AST 13 11/29/2020   ALT 17 11/29/2020   PROT 6.6 11/29/2020   ALBUMIN 4.3 11/29/2020   CALCIUM 9.5 11/29/2020   ANIONGAP 11 09/22/2015   GFR 90.83 11/29/2020   Lab Results  Component Value Date   CHOL 192 11/29/2020   Lab Results  Component Value Date   HDL 40.30 11/29/2020   Lab Results  Component Value Date   LDLCALC 125 (H) 11/29/2020   Lab Results  Component Value Date   TRIG 133.0 11/29/2020   Lab Results  Component Value Date   CHOLHDL 5 11/29/2020   No results found for: "HGBA1C"      Assessment & Plan:   Problem List Items Addressed This Visit       Unprioritized   Preventative health care - Primary    ghm utd Check labs  See avs       Relevant Orders   CBC with Differential/Platelet   Comprehensive metabolic panel   Lipid panel   TSH   Lateral epicondylitis of right elbow    Elbow sleeve/ tennis elbow strap voltaren get , tylenol Sport med if no better       Elevated BP without diagnosis of hypertension    Dash diet Recheck 2 weeks         No orders of the defined types were placed in this encounter.   IDonato Schultz, DO, personally preformed the services described in this documentation.  All medical  record entries made by the scribe were at my direction and in my presence.  I have reviewed the chart and discharge instructions (if applicable) and agree that the record reflects my personal performance and is accurate and complete. 01/25/2022    I,Tinashe Williams,acting as a scribe for Levi Schultz, DO.,have documented all relevant documentation on the behalf of Levi Schultz, DO,as directed by  Levi Schultz, DO while in the presence of Levi Schultz, DO.   Levi Schultz, DO

## 2022-01-25 NOTE — Assessment & Plan Note (Signed)
Elbow sleeve/ tennis elbow strap voltaren get , tylenol Sport med if no better

## 2022-01-25 NOTE — Assessment & Plan Note (Signed)
ghm utd Check labs  See avs  

## 2022-01-25 NOTE — Patient Instructions (Signed)

## 2022-01-25 NOTE — Assessment & Plan Note (Signed)
Dash diet Recheck 2 weeks

## 2022-01-26 LAB — LIPID PANEL
Cholesterol: 206 mg/dL — ABNORMAL HIGH (ref 0–200)
HDL: 42.8 mg/dL (ref >39.00)
LDL Cholesterol: 134 mg/dL — ABNORMAL HIGH (ref 0–99)
NonHDL: 162.77
Total CHOL/HDL Ratio: 5
Triglycerides: 146 mg/dL (ref 0.0–149.0)
VLDL: 29.2 mg/dL (ref 0.0–40.0)

## 2022-01-26 LAB — COMPREHENSIVE METABOLIC PANEL
ALT: 30 U/L (ref 0–53)
AST: 20 U/L (ref 0–37)
Albumin: 4.5 g/dL (ref 3.5–5.2)
Alkaline Phosphatase: 71 U/L (ref 39–117)
BUN: 17 mg/dL (ref 6–23)
CO2: 27 mEq/L (ref 19–32)
Calcium: 9.5 mg/dL (ref 8.4–10.5)
Chloride: 104 mEq/L (ref 96–112)
Creatinine, Ser: 1.1 mg/dL (ref 0.40–1.50)
GFR: 82.29 mL/min (ref >60.00)
Glucose, Bld: 81 mg/dL (ref 70–99)
Potassium: 4.6 mEq/L (ref 3.5–5.1)
Sodium: 138 mEq/L (ref 135–145)
Total Bilirubin: 0.4 mg/dL (ref 0.2–1.2)
Total Protein: 7 g/dL (ref 6.0–8.3)

## 2022-01-26 LAB — CBC WITH DIFFERENTIAL/PLATELET
Basophils Absolute: 0 10*3/uL (ref 0.0–0.1)
Basophils Relative: 0.7 % (ref 0.0–3.0)
Eosinophils Absolute: 0.2 10*3/uL (ref 0.0–0.7)
Eosinophils Relative: 2.8 % (ref 0.0–5.0)
HCT: 46.4 % (ref 39.0–52.0)
Hemoglobin: 15.8 g/dL (ref 13.0–17.0)
Lymphocytes Relative: 26.8 % (ref 12.0–46.0)
Lymphs Abs: 1.7 10*3/uL (ref 0.7–4.0)
MCHC: 34 g/dL (ref 30.0–36.0)
MCV: 91.7 fl (ref 78.0–100.0)
Monocytes Absolute: 0.5 10*3/uL (ref 0.1–1.0)
Monocytes Relative: 7.7 % (ref 3.0–12.0)
Neutro Abs: 4 10*3/uL (ref 1.4–7.7)
Neutrophils Relative %: 62 % (ref 43.0–77.0)
Platelets: 207 10*3/uL (ref 150.0–400.0)
RBC: 5.06 Mil/uL (ref 4.22–5.81)
RDW: 13.4 % (ref 11.5–15.5)
WBC: 6.5 10*3/uL (ref 4.0–10.5)

## 2022-01-26 LAB — TSH: TSH: 2.06 u[IU]/mL (ref 0.35–5.50)

## 2022-01-29 ENCOUNTER — Telehealth: Payer: Self-pay | Admitting: Family Medicine

## 2022-01-29 DIAGNOSIS — F419 Anxiety disorder, unspecified: Secondary | ICD-10-CM

## 2022-01-29 NOTE — Telephone Encounter (Signed)
[facility] called to get a refill on pt's Paroxetine. After reviewing chart, advised rep that we had sent that over on 7.26.23 and we had a confirmation of receipt same day at 2:41p. Rep stated that they did not have it and would need it resent when possible.

## 2022-01-30 MED ORDER — PAROXETINE HCL 10 MG PO TABS: 10.0000 mg | ORAL_TABLET | Freq: Every day | ORAL | 1 refills | Status: DC

## 2022-01-30 NOTE — Telephone Encounter (Signed)
Refills sent

## 2022-02-06 ENCOUNTER — Encounter: Payer: Self-pay | Admitting: Family Medicine

## 2022-02-06 ENCOUNTER — Other Ambulatory Visit: Payer: Self-pay | Admitting: Family Medicine

## 2022-02-06 ENCOUNTER — Ambulatory Visit: Payer: 59 | Admitting: Family Medicine

## 2022-02-06 VITALS — BP 160/100 | HR 81 | Temp 98.3°F | Resp 18 | Ht 74.0 in | Wt 280.6 lb

## 2022-02-06 DIAGNOSIS — M5441 Lumbago with sciatica, right side: Secondary | ICD-10-CM | POA: Diagnosis not present

## 2022-02-06 DIAGNOSIS — E785 Hyperlipidemia, unspecified: Secondary | ICD-10-CM

## 2022-02-06 DIAGNOSIS — I10 Essential (primary) hypertension: Secondary | ICD-10-CM | POA: Diagnosis not present

## 2022-02-06 MED ORDER — LOSARTAN POTASSIUM 50 MG PO TABS: 50.0000 mg | ORAL_TABLET | Freq: Every day | ORAL | 1 refills | Status: DC

## 2022-02-06 NOTE — Patient Instructions (Signed)
Hypertension, Adult High blood pressure (hypertension) is when the force of blood pumping through the arteries is too strong. The arteries are the blood vessels that carry blood from the heart throughout the body. Hypertension forces the heart to work harder to pump blood and may cause arteries to become narrow or stiff. Untreated or uncontrolled hypertension can lead to a heart attack, heart failure, a stroke, kidney disease, and other problems. A blood pressure reading consists of a higher number over a lower number. Ideally, your blood pressure should be below 120/80. The first ("top") number is called the systolic pressure. It is a measure of the pressure in your arteries as your heart beats. The second ("bottom") number is called the diastolic pressure. It is a measure of the pressure in your arteries as the heart relaxes. What are the causes? The exact cause of this condition is not known. There are some conditions that result in high blood pressure. What increases the risk? Certain factors may make you more likely to develop high blood pressure. Some of these risk factors are under your control, including: Smoking. Not getting enough exercise or physical activity. Being overweight. Having too much fat, sugar, calories, or salt (sodium) in your diet. Drinking too much alcohol. Other risk factors include: Having a personal history of heart disease, diabetes, high cholesterol, or kidney disease. Stress. Having a family history of high blood pressure and high cholesterol. Having obstructive sleep apnea. Age. The risk increases with age. What are the signs or symptoms? High blood pressure may not cause symptoms. Very high blood pressure (hypertensive crisis) may cause: Headache. Fast or irregular heartbeats (palpitations). Shortness of breath. Nosebleed. Nausea and vomiting. Vision changes. Severe chest pain, dizziness, and seizures. How is this diagnosed? This condition is diagnosed by  measuring your blood pressure while you are seated, with your arm resting on a flat surface, your legs uncrossed, and your feet flat on the floor. The cuff of the blood pressure monitor will be placed directly against the skin of your upper arm at the level of your heart. Blood pressure should be measured at least twice using the same arm. Certain conditions can cause a difference in blood pressure between your right and left arms. If you have a high blood pressure reading during one visit or you have normal blood pressure with other risk factors, you may be asked to: Return on a different day to have your blood pressure checked again. Monitor your blood pressure at home for 1 week or longer. If you are diagnosed with hypertension, you may have other blood or imaging tests to help your health care provider understand your overall risk for other conditions. How is this treated? This condition is treated by making healthy lifestyle changes, such as eating healthy foods, exercising more, and reducing your alcohol intake. You may be referred for counseling on a healthy diet and physical activity. Your health care provider may prescribe medicine if lifestyle changes are not enough to get your blood pressure under control and if: Your systolic blood pressure is above 130. Your diastolic blood pressure is above 80. Your personal target blood pressure may vary depending on your medical conditions, your age, and other factors. Follow these instructions at home: Eating and drinking  Eat a diet that is high in fiber and potassium, and low in sodium, added sugar, and fat. An example of this eating plan is called the DASH diet. DASH stands for Dietary Approaches to Stop Hypertension. To eat this way: Eat   plenty of fresh fruits and vegetables. Try to fill one half of your plate at each meal with fruits and vegetables. Eat whole grains, such as whole-wheat pasta, brown rice, or whole-grain bread. Fill about one  fourth of your plate with whole grains. Eat or drink low-fat dairy products, such as skim milk or low-fat yogurt. Avoid fatty cuts of meat, processed or cured meats, and poultry with skin. Fill about one fourth of your plate with lean proteins, such as fish, chicken without skin, beans, eggs, or tofu. Avoid pre-made and processed foods. These tend to be higher in sodium, added sugar, and fat. Reduce your daily sodium intake. Many people with hypertension should eat less than 1,500 mg of sodium a day. Do not drink alcohol if: Your health care provider tells you not to drink. You are pregnant, may be pregnant, or are planning to become pregnant. If you drink alcohol: Limit how much you have to: 0-1 drink a day for women. 0-2 drinks a day for men. Know how much alcohol is in your drink. In the [place] one drink equals one 12 oz bottle of beer (355 mL), one 5 oz glass of wine (148 mL), or one 1 oz glass of hard liquor (44 mL). Lifestyle  Work with your health care provider to maintain a healthy body weight or to lose weight. Ask what an ideal weight is for you. Get at least 30 minutes of exercise that causes your heart to beat faster (aerobic exercise) most days of the week. Activities may include walking, swimming, or biking. Include exercise to strengthen your muscles (resistance exercise), such as Pilates or lifting weights, as part of your weekly exercise routine. Try to do these types of exercises for 30 minutes at least 3 days a week. Do not use any products that contain nicotine or tobacco. These products include cigarettes, chewing tobacco, and vaping devices, such as e-cigarettes. If you need help quitting, ask your health care provider. Monitor your blood pressure at home as told by your health care provider. Keep all follow-up visits. This is important. Medicines Take over-the-counter and prescription medicines only as told by your health care provider. Follow directions carefully. Blood  pressure medicines must be taken as prescribed. Do not skip doses of blood pressure medicine. Doing this puts you at risk for problems and can make the medicine less effective. Ask your health care provider about side effects or reactions to medicines that you should watch for. Contact a health care provider if you: Think you are having a reaction to a medicine you are taking. Have headaches that keep coming back (recurring). Feel dizzy. Have swelling in your ankles. Have trouble with your vision. Get help right away if you: Develop a severe headache or confusion. Have unusual weakness or numbness. Feel faint. Have severe pain in your chest or abdomen. Vomit repeatedly. Have trouble breathing. These symptoms may be an emergency. Get help right away. Call 911. Do not wait to see if the symptoms will go away. Do not drive yourself to the hospital. Summary Hypertension is when the force of blood pumping through your arteries is too strong. If this condition is not controlled, it may put you at risk for serious complications. Your personal target blood pressure may vary depending on your medical conditions, your age, and other factors. For most people, a normal blood pressure is less than 120/80. Hypertension is treated with lifestyle changes, medicines, or a combination of both. Lifestyle changes include losing weight, eating a healthy,   low-sodium diet, exercising more, and limiting alcohol. This information is not intended to replace advice given to you by your health care provider. Make sure you discuss any questions you have with your health care provider. Document Revised: 04/25/2021 Document Reviewed: 04/25/2021 Elsevier Patient Education  2023 Elsevier Inc.  

## 2022-02-06 NOTE — Progress Notes (Signed)
Subjective:   By signing my name below, I, [name], attest that this documentation has been prepared under the direction and in the presence of Dr. Seabron Spates, DO. 02/06/2022    Patient ID: Levi Berger, male    DOB: [birth year], 41 y.o.   MRN: [id number]  Chief Complaint  Patient presents with   Hypertension   Follow-up    HPI Patient is in today for a follow up visit.   He continues having back pain at this time. His pain is mainly upper and lower back. He finds most of his pain comes from wearing his heavy Kevlar vest most of the day while at work. He is stretching and using foam rollers to help manage his pain. He has tried seeing a [facility] but stopped due to not having the time to attend visits. He has not tried massages but is planning on trying one at least once a month.  His blood pressure is elevated during this visit. He has not taken blood pressure medication in the past. He has no known family medical history of high blood pressure. He denies any headaches, chest pain, shortness of breath, leg swelling.  BP Readings from Last 3 Encounters:  02/06/22 (!) 160/100  01/25/22 (!) 142/100  12/19/20 110/88   Pulse Readings from Last 3 Encounters:  02/06/22 81  01/25/22 86  12/19/20 84   He reports gaining weight recently due to not controlling his diet for the past 2 months. He is planning on losing the weight he gained.  Wt Readings from Last 3 Encounters:  02/06/22 280 lb 9.6 oz (127.3 kg)  01/25/22 287 lb 9.6 oz (130.5 kg)  12/19/20 266 lb 9.6 oz (120.9 kg)   His father had a history of diabetes. He passed away from lung cancer at age 39.    Past Medical History:  Diagnosis Date   Generalized anxiety disorder 12/26/2015   Palpitations     Past Surgical History:  Procedure Laterality Date   None     TRANSTHORACIC ECHOCARDIOGRAM  01/2012   Normal LV size and thickness. Normal wall motion with EF of 55-60%. Possible grade 2 diastolic  dysfunction. Otherwise normal valve function.    Family History  Problem Relation Age of Onset   Atrial fibrillation Mother    Cancer Father    Lung cancer Father        Smoker   Hypertension Father    Diabetes Father    Heart disease Maternal Grandfather    Alcohol abuse Maternal Grandfather    Arthritis Paternal Grandfather    Colon cancer Paternal Grandfather    Diabetes Paternal Grandfather     Social History   Socioeconomic History   Marital status: Single    Spouse name: Not on file   Number of children: 0   Years of education: Not on file   Highest education level: Not on file  Occupational History   Occupation: [employer] forces operational officer [employer]    Employer: UNEMPLOYED  Tobacco Use   Smoking status: Never    Passive exposure: Yes   Smokeless tobacco: Former   Tobacco comments:    pt occ smokes a pipe maybe once or twice a month  Vaping Use   Vaping Use: Never used  Substance and Sexual Activity   Alcohol use: Yes    Alcohol/week: 5.0 standard drinks of alcohol    Types: 5 Standard drinks or equivalent per week    Comment: ocassionally   Drug use: No  Sexual activity: Not on file  Other Topics Concern   Not on file  Social History Narrative   Not on file   Social Determinants of Health   Financial Resource Strain: Not on file  Food Insecurity: Not on file  Transportation Needs: Not on file  Physical Activity: Not on file  Stress: Not on file  Social Connections: Not on file  Intimate Partner Violence: Not on file    Outpatient Medications Prior to Visit  Medication Sig Dispense Refill   ibuprofen (ADVIL,MOTRIN) 800 MG tablet Take 1 tablet (800 mg total) by mouth every 8 (eight) hours as needed. 30 tablet 3   Krill Oil 500 MG CAPS Take by mouth.     PARoxetine (PAXIL) 10 MG tablet Take 1 tablet (10 mg total) by mouth daily. 90 tablet 1   No facility-administered medications prior to visit.    No Known Allergies  Review of Systems  Respiratory:  Negative for  shortness of breath.   Cardiovascular:  Negative for chest pain and leg swelling.  Neurological:  Positive for headaches.       Objective:    Physical Exam Constitutional:      General: He is not in acute distress.    Appearance: Normal appearance. He is not ill-appearing.  HENT:     Head: Normocephalic and atraumatic.     Right Ear: External ear normal.     Left Ear: External ear normal.  Eyes:     Extraocular Movements: Extraocular movements intact.     Pupils: Pupils are equal, round, and reactive to light.  Cardiovascular:     Rate and Rhythm: Normal rate and regular rhythm.     Heart sounds: Normal heart sounds. No murmur heard.    No gallop.     Comments: Blood pressure measured  Pulmonary:     Effort: Pulmonary effort is normal. No respiratory distress.     Breath sounds: Normal breath sounds. No wheezing or rales.  Skin:    General: Skin is warm and dry.  Neurological:     Mental Status: He is alert and oriented to person, place, and time.  Psychiatric:        Judgment: Judgment normal.     BP (!) 160/100 (BP Location: Left Arm, Patient Position: Sitting, Cuff Size: Large)   Pulse 81   Temp 98.3 F (36.8 C) (Oral)   Resp 18   Ht 6\' 2"  (1.88 m)   Wt 280 lb 9.6 oz (127.3 kg)   SpO2 98%   BMI 36.03 kg/m  Wt Readings from Last 3 Encounters:  02/06/22 280 lb 9.6 oz (127.3 kg)  01/25/22 287 lb 9.6 oz (130.5 kg)  12/19/20 266 lb 9.6 oz (120.9 kg)    Diabetic Foot Exam - Simple   No data filed    Lab Results  Component Value Date   WBC 6.5 01/25/2022   HGB 15.8 01/25/2022   HCT 46.4 01/25/2022   PLT 207.0 01/25/2022   GLUCOSE 81 01/25/2022   CHOL 206 (H) 01/25/2022   TRIG 146.0 01/25/2022   HDL 42.80 01/25/2022   LDLCALC 134 (H) 01/25/2022   ALT 30 01/25/2022   AST 20 01/25/2022   NA 138 01/25/2022   K 4.6 01/25/2022   CL 104 01/25/2022   CREATININE 1.10 01/25/2022   BUN 17 01/25/2022   CO2 27 01/25/2022   TSH 2.06 01/25/2022   PSA 1.76  11/29/2020    Lab Results  Component Value Date   TSH  2.06 01/25/2022   Lab Results  Component Value Date   WBC 6.5 01/25/2022   HGB 15.8 01/25/2022   HCT 46.4 01/25/2022   MCV 91.7 01/25/2022   PLT 207.0 01/25/2022   Lab Results  Component Value Date   NA 138 01/25/2022   K 4.6 01/25/2022   CO2 27 01/25/2022   GLUCOSE 81 01/25/2022   BUN 17 01/25/2022   CREATININE 1.10 01/25/2022   BILITOT 0.4 01/25/2022   ALKPHOS 71 01/25/2022   AST 20 01/25/2022   ALT 30 01/25/2022   PROT 7.0 01/25/2022   ALBUMIN 4.5 01/25/2022   CALCIUM 9.5 01/25/2022   ANIONGAP 11 09/22/2015   GFR 82.29 01/25/2022   Lab Results  Component Value Date   CHOL 206 (H) 01/25/2022   Lab Results  Component Value Date   HDL 42.80 01/25/2022   Lab Results  Component Value Date   LDLCALC 134 (H) 01/25/2022   Lab Results  Component Value Date   TRIG 146.0 01/25/2022   Lab Results  Component Value Date   CHOLHDL 5 01/25/2022   No results found for: "HGBA1C"     Assessment & Plan:   Problem List Items Addressed This Visit       Unprioritized   Primary hypertension - Primary    Start meds Poorly controlled will alter medications, encouraged DASH diet, minimize caffeine and obtain adequate sleep. Report concerning symptoms and follow up as directed and as needed      Relevant Medications   losartan (COZAAR) 50 MG tablet   Acute right-sided low back pain with right-sided sciatica     Meds ordered this encounter  Medications   losartan (COZAAR) 50 MG tablet    Sig: Take 1 tablet (50 mg total) by mouth daily.    Dispense:  90 tablet    Refill:  1    I, [name], DO, personally preformed the services described in this documentation.  All medical record entries made by the scribe were at my direction and in my presence.  I have reviewed the chart and discharge instructions (if applicable) and agree that the record reflects my personal performance and is accurate and  complete. 02/06/2022   I,[name],acting as a scribe for [name], DO.,have documented all relevant documentation on the behalf of [name], DO,as directed by  [name], DO while in the presence of [name], DO.   [name], DO

## 2022-02-10 DIAGNOSIS — I10 Essential (primary) hypertension: Secondary | ICD-10-CM | POA: Insufficient documentation

## 2022-02-10 DIAGNOSIS — M5441 Lumbago with sciatica, right side: Secondary | ICD-10-CM | POA: Insufficient documentation

## 2022-02-10 NOTE — Assessment & Plan Note (Signed)
Start meds Poorly controlled will alter medications, encouraged DASH diet, minimize caffeine and obtain adequate sleep. Report concerning symptoms and follow up as directed and as needed

## 2022-02-27 ENCOUNTER — Ambulatory Visit: Payer: 59 | Admitting: Family Medicine

## 2022-02-27 ENCOUNTER — Encounter: Payer: Self-pay | Admitting: Family Medicine

## 2022-02-27 VITALS — BP 150/100 | HR 85 | Temp 97.9°F | Resp 18 | Ht 74.0 in | Wt 279.8 lb

## 2022-02-27 DIAGNOSIS — I10 Essential (primary) hypertension: Secondary | ICD-10-CM

## 2022-02-27 DIAGNOSIS — F419 Anxiety disorder, unspecified: Secondary | ICD-10-CM | POA: Diagnosis not present

## 2022-02-27 DIAGNOSIS — F411 Generalized anxiety disorder: Secondary | ICD-10-CM | POA: Diagnosis not present

## 2022-02-27 MED ORDER — LOSARTAN POTASSIUM-HCTZ 100-12.5 MG PO TABS: 1.0000 | ORAL_TABLET | Freq: Every day | ORAL | 1 refills | Status: DC

## 2022-02-27 MED ORDER — PAROXETINE HCL 10 MG PO TABS: 10.0000 mg | ORAL_TABLET | Freq: Every day | ORAL | 1 refills | Status: DC

## 2022-02-27 NOTE — Assessment & Plan Note (Signed)
Restart paxil 10 mg daily bp check 2-3 weeks

## 2022-02-27 NOTE — Assessment & Plan Note (Signed)
Poorly controlled will alter medications, encouraged DASH diet, minimize caffeine and obtain adequate sleep. Report concerning symptoms and follow up as directed and as needed 

## 2022-02-27 NOTE — Progress Notes (Signed)
Established Patient Office Visit  Subjective   Patient ID: Levi Berger, male    DOB: Nov 09, 1980  Age: 41 y.o. MRN: [id number]  Chief Complaint  Patient presents with   Hypertension    Pt states home bps have been reading very high.    Follow-up    HPI Pt here f/u bp.  His bp was high last night on his bp monitor.  He , as a result, had a panic attack which made it worse.  No headache, no cp or palpitations.   He has been under a lot of stress at home and work.   Patient Active Problem List   Diagnosis Date Noted   Primary hypertension 02/10/2022   Acute right-sided low back pain with right-sided sciatica 02/10/2022   Lateral epicondylitis of right elbow 01/25/2022   Elevated BP without diagnosis of hypertension 11/29/2020   OSA (obstructive sleep apnea) 11/22/2016   Fatigue 09/11/2016   Blocked premature atrial contraction 05/17/2016   Generalized anxiety disorder 12/26/2015   Chronic fatigue 12/26/2015   B12 deficiency 11/07/2015   Dizziness 10/02/2015   Preventative health care 10/02/2015   Sciatica 06/02/2014   Past Medical History:  Diagnosis Date   Generalized anxiety disorder 12/26/2015   Palpitations    Past Surgical History:  Procedure Laterality Date   None     TRANSTHORACIC ECHOCARDIOGRAM  01/2012   Normal LV size and thickness. Normal wall motion with EF of 55-60%. Possible grade 2 diastolic dysfunction. Otherwise normal valve function.   Social History   Tobacco Use   Smoking status: Never    Passive exposure: Yes   Smokeless tobacco: Former   Tobacco comments:    pt occ smokes a pipe maybe once or twice a month  Vaping Use   Vaping Use: Never used  Substance Use Topics   Alcohol use: Yes    Alcohol/week: 5.0 standard drinks of alcohol    Types: 5 Standard drinks or equivalent per week    Comment: ocassionally   Drug use: No   Social History   Socioeconomic History   Marital status: Single    Spouse name: Not on file   Number of  children: 0   Years of education: Not on file   Highest education level: Not on file  Occupational History   Occupation: [employer]    Employer: UNEMPLOYED  Tobacco Use   Smoking status: Never    Passive exposure: Yes   Smokeless tobacco: Former   Tobacco comments:    pt occ smokes a pipe maybe once or twice a month  Vaping Use   Vaping Use: Never used  Substance and Sexual Activity   Alcohol use: Yes    Alcohol/week: 5.0 standard drinks of alcohol    Types: 5 Standard drinks or equivalent per week    Comment: ocassionally   Drug use: No   Sexual activity: Not on file  Other Topics Concern   Not on file  Social History Narrative   Not on file   Social Determinants of Health   Financial Resource Strain: Not on file  Food Insecurity: Not on file  Transportation Needs: Not on file  Physical Activity: Not on file  Stress: Not on file  Social Connections: Not on file  Intimate Partner Violence: Not on file   Family Status  Relation Name Status   Mother  Alive   Father  Deceased at age 29       lung cancer-- smoker   MGF  (  Not Specified)   PGF  (Not Specified)   Family History  Problem Relation Age of Onset   Atrial fibrillation Mother    Cancer Father    Lung cancer Father        Smoker   Hypertension Father    Diabetes Father    Heart disease Maternal Grandfather    Alcohol abuse Maternal Grandfather    Arthritis Paternal Grandfather    Colon cancer Paternal Grandfather    Diabetes Paternal Grandfather    No Known Allergies    Review of Systems  Constitutional:  Negative for fever and malaise/fatigue.  HENT:  Negative for congestion.   Eyes:  Negative for blurred vision.  Respiratory:  Negative for shortness of breath.   Cardiovascular:  Negative for chest pain, palpitations and leg swelling.  Gastrointestinal:  Negative for abdominal pain, blood in stool and nausea.  Genitourinary:  Negative for dysuria and frequency.  Musculoskeletal:  Negative  for falls.  Skin:  Negative for rash.  Neurological:  Negative for dizziness, loss of consciousness and headaches.  Endo/Heme/Allergies:  Negative for environmental allergies.  Psychiatric/Behavioral:  Negative for depression, hallucinations, substance abuse and suicidal ideas. The patient is nervous/anxious and has insomnia.       Objective:     BP (!) 150/100 (BP Location: Left Arm, Patient Position: Sitting, Cuff Size: Large)   Pulse 85   Temp 97.9 F (36.6 C) (Oral)   Resp 18   Ht 6\' 2"  (1.88 m)   Wt 279 lb 12.8 oz (126.9 kg)   SpO2 95%   BMI 35.92 kg/m  BP Readings from Last 3 Encounters:  02/27/22 (!) 150/100  02/06/22 (!) 160/100  01/25/22 (!) 142/100   Wt Readings from Last 3 Encounters:  02/27/22 279 lb 12.8 oz (126.9 kg)  02/06/22 280 lb 9.6 oz (127.3 kg)  01/25/22 287 lb 9.6 oz (130.5 kg)   SpO2 Readings from Last 3 Encounters:  02/27/22 95%  02/06/22 98%  01/25/22 97%      Physical Exam Vitals and nursing note reviewed.  Constitutional:      Appearance: He is well-developed.  HENT:     Head: Normocephalic and atraumatic.  Eyes:     Pupils: Pupils are equal, round, and reactive to light.  Neck:     Thyroid: No thyromegaly.  Cardiovascular:     Rate and Rhythm: Normal rate and regular rhythm.     Heart sounds: No murmur heard. Pulmonary:     Effort: Pulmonary effort is normal. No respiratory distress.     Breath sounds: Normal breath sounds. No wheezing or rales.  Chest:     Chest wall: No tenderness.  Musculoskeletal:     Cervical back: Normal range of motion and neck supple.     Right hip: Tenderness present. Normal range of motion. Normal strength.     Left hip: Tenderness present. Normal range of motion. Normal strength.     Right foot: Bony tenderness present. No swelling.     Left foot: Bony tenderness present. No swelling.  Skin:    General: Skin is warm and dry.  Neurological:     Mental Status: He is alert and oriented to person,  place, and time.  Psychiatric:        Attention and Perception: Attention normal.        Mood and Affect: Mood is anxious. Mood is not depressed or elated. Affect is not labile, blunt, flat, angry, tearful or inappropriate.  Speech: Speech normal.        Behavior: Behavior normal.        Thought Content: Thought content normal.        Judgment: Judgment normal.      No results found for any visits on 02/27/22.  Last CBC Lab Results  Component Value Date   WBC 6.5 01/25/2022   HGB 15.8 01/25/2022   HCT 46.4 01/25/2022   MCV 91.7 01/25/2022   MCH 31.1 09/05/2018   RDW 13.4 01/25/2022   PLT 207.0 01/25/2022   Last metabolic panel Lab Results  Component Value Date   GLUCOSE 81 01/25/2022   NA 138 01/25/2022   K 4.6 01/25/2022   CL 104 01/25/2022   CO2 27 01/25/2022   BUN 17 01/25/2022   CREATININE 1.10 01/25/2022   GFRNONAA >60 09/22/2015   CALCIUM 9.5 01/25/2022   PROT 7.0 01/25/2022   ALBUMIN 4.5 01/25/2022   BILITOT 0.4 01/25/2022   ALKPHOS 71 01/25/2022   AST 20 01/25/2022   ALT 30 01/25/2022   ANIONGAP 11 09/22/2015   Last lipids Lab Results  Component Value Date   CHOL 206 (H) 01/25/2022   HDL 42.80 01/25/2022   LDLCALC 134 (H) 01/25/2022   TRIG 146.0 01/25/2022   CHOLHDL 5 01/25/2022   Last hemoglobin A1c No results found for: "HGBA1C" Last thyroid functions Lab Results  Component Value Date   TSH 2.06 01/25/2022   Last vitamin D No results found for: "25OHVITD2", "25OHVITD3", "VD25OH" Last vitamin B12 and Folate Lab Results  Component Value Date   VITAMINB12 798 12/08/2015      The 10-year ASCVD risk score (Arnett DK, et al., 2019) is: 3.2%    Assessment & Plan:   Problem List Items Addressed This Visit       Unprioritized   Primary hypertension - Primary    Poorly controlled will alter medications, encouraged DASH diet, minimize caffeine and obtain adequate sleep. Report concerning symptoms and follow up as directed and as  needed      Relevant Medications   losartan-hydrochlorothiazide (HYZAAR) 100-12.5 MG tablet   Generalized anxiety disorder    Restart paxil 10 mg daily bp check 2-3 weeks       Relevant Medications   PARoxetine (PAXIL) 10 MG tablet   Other Visit Diagnoses     Anxiety       Relevant Medications   PARoxetine (PAXIL) 10 MG tablet       Return in about 3 weeks (around 03/20/2022), or if symptoms worsen or fail to improve, for hypertension---- nurse visit for bp check.    [name], DO

## 2022-02-27 NOTE — Patient Instructions (Signed)

## 2022-03-27 ENCOUNTER — Ambulatory Visit (INDEPENDENT_AMBULATORY_CARE_PROVIDER_SITE_OTHER): Payer: 59

## 2022-03-27 DIAGNOSIS — I10 Essential (primary) hypertension: Secondary | ICD-10-CM | POA: Diagnosis not present

## 2022-03-27 NOTE — Progress Notes (Signed)
Pt here for Blood pressure check per    Pt currently takes: Losartan-hydrochlorothiazide  100-12.5 mg( hasn't picked up yet) , patient is taking Losartan 50 mg 2 tablets once daily .   Pt reports compliance with medication.  BP today @ = 136/90 HR =88  Pt advised per Dr.Lowne to start taking combination blood pressure medicine , record blood pressure when patient starts to feel dizzy and patient is scheduled for a 2 week bp check

## 2022-04-09 ENCOUNTER — Encounter: Payer: Self-pay | Admitting: Family Medicine

## 2022-04-09 ENCOUNTER — Ambulatory Visit: Payer: 59

## 2022-04-09 NOTE — Progress Notes (Signed)
Pt here for Blood pressure check per  Dr. Carollee Herter   Pt currently takes: ~Losartan-Hctz [facility]) 100-12.5 mg daily   Pt reports compliance with medication.  BP today @ = 150/90 HR =76 O2= 96  (Home reading range from 150/85 to around 117/79. He reports he will get [place] a copy of home reading once he gets home)  BP today: 140/100 and HR:78  Pt advised per Dr. Lorelei Pont   BP Readings from Last 3 Encounters:  02/27/22 (!) 150/100  02/06/22 (!) 160/100  01/25/22 (!) 142/100   To go to   LOSARTAN POTASSIUM-HCTZ 100-25 MG PO TABS      Pt decline med increase due to just picking up the last bottle of medications. He is going to send [place] the home reading later today.   He did make a 4 week appt with Dr. Carollee Herter.

## 2022-05-07 ENCOUNTER — Encounter: Payer: Self-pay | Admitting: Family Medicine

## 2022-05-07 ENCOUNTER — Ambulatory Visit: Payer: 59 | Admitting: Family Medicine

## 2022-05-07 VITALS — BP 118/98 | HR 84 | Temp 98.8°F | Resp 18 | Ht 74.0 in | Wt 278.0 lb

## 2022-05-07 DIAGNOSIS — I10 Essential (primary) hypertension: Secondary | ICD-10-CM | POA: Diagnosis not present

## 2022-05-07 DIAGNOSIS — M549 Dorsalgia, unspecified: Secondary | ICD-10-CM

## 2022-05-07 NOTE — Progress Notes (Signed)
Subjective:   By signing my name below, I, Levi Berger, attest that this documentation has been prepared under the direction and in the presence of Levi Berger 05/07/2022   Patient ID: Levi Berger, male    DOB: 1981-01-13, 41 y.o.   MRN: [id number]  Chief Complaint  Patient presents with   Hypertension   Follow-up    HPI Patient is in today for an office visit  As of today's visit, his blood pressure is improving. He regularly checks his blood pressure at home. He states that his diastolic blood pressure is usually between 79-84 mmHg. He usually does not get any outliers at home. BP Readings from Last 3 Encounters:  05/07/22 (!) 118/98  02/27/22 (!) 150/100  02/06/22 (!) 160/100   Pulse Readings from Last 3 Encounters:  05/07/22 84  02/27/22 85  02/06/22 81   He reports that his chronic back pain is persistent. Symptoms begun about 3 years after he started his job. He states that his upper muscles are knotted due to his job. He is interested in having a deep tissue massage to alleviate his knots.   Past Medical History:  Diagnosis Date   Generalized anxiety disorder 12/26/2015   Palpitations     Past Surgical History:  Procedure Laterality Date   None     TRANSTHORACIC ECHOCARDIOGRAM  01/2012   Normal LV size and thickness. Normal wall motion with EF of 55-60%. Possible grade 2 diastolic dysfunction. Otherwise normal valve function.    Family History  Problem Relation Age of Onset   Atrial fibrillation Mother    Cancer Father    Lung cancer Father        Smoker   Hypertension Father    Diabetes Father    Heart disease Maternal Grandfather    Alcohol abuse Maternal Grandfather    Arthritis Paternal Grandfather    Colon cancer Paternal Grandfather    Diabetes Paternal Grandfather     Social History   Socioeconomic History   Marital status: Single    Spouse name: Not on file   Number of children: 0   Years of education: Not on file    Highest education level: Not on file  Occupational History   Occupation: Solicitor [employer]    Employer: UNEMPLOYED  Tobacco Use   Smoking status: Never    Passive exposure: Yes   Smokeless tobacco: Former   Tobacco comments:    pt occ smokes a pipe maybe once or twice a month  Vaping Use   Vaping Use: Never used  Substance and Sexual Activity   Alcohol use: Yes    Alcohol/week: 5.0 standard drinks of alcohol    Types: 5 Standard drinks or equivalent per week    Comment: ocassionally   Drug use: No   Sexual activity: Not on file  Other Topics Concern   Not on file  Social History Narrative   Not on file   Social Determinants of Health   Financial Resource Strain: Not on file  Food Insecurity: Not on file  Transportation Needs: Not on file  Physical Activity: Not on file  Stress: Not on file  Social Connections: Not on file  Intimate Partner Violence: Not on file    Outpatient Medications Prior to Visit  Medication Sig Dispense Refill   Krill Oil 500 MG CAPS Take by mouth.     PARoxetine (PAXIL) 10 MG tablet Take 1 tablet (10 mg total) by mouth daily. 90 tablet 1  No facility-administered medications prior to visit.    No Known Allergies  Review of Systems  Constitutional:  Negative for chills, fever and malaise/fatigue.  HENT:  Negative for congestion and hearing loss.   Eyes:  Negative for discharge.  Respiratory:  Negative for cough, sputum production and shortness of breath.   Cardiovascular:  Negative for chest pain, palpitations and leg swelling.  Gastrointestinal:  Negative for abdominal pain, blood in stool, constipation, diarrhea, heartburn, nausea and vomiting.  Genitourinary:  Negative for dysuria, frequency, hematuria and urgency.  Musculoskeletal:  Negative for back pain, falls and myalgias.  Skin:  Negative for rash.  Neurological:  Negative for dizziness, sensory change, loss of consciousness, weakness and headaches.  Endo/Heme/Allergies:  Negative  for environmental allergies. Does not bruise/bleed easily.  Psychiatric/Behavioral:  Negative for depression and suicidal ideas. The patient is not nervous/anxious and does not have insomnia.        Objective:    Physical Exam Vitals and nursing note reviewed.  Constitutional:      General: He is not in acute distress.    Appearance: Normal appearance. He is not ill-appearing.  HENT:     Head: Normocephalic and atraumatic.     Right Ear: External ear normal.     Left Ear: External ear normal.  Eyes:     Extraocular Movements: Extraocular movements intact.     Pupils: Pupils are equal, round, and reactive to light.  Cardiovascular:     Rate and Rhythm: Normal rate and regular rhythm.     Heart sounds: Normal heart sounds. No murmur heard.    No gallop.  Pulmonary:     Effort: Pulmonary effort is normal. No respiratory distress.     Breath sounds: Normal breath sounds. No wheezing or rales.  Skin:    General: Skin is warm and dry.  Neurological:     General: No focal deficit present.     Mental Status: He is alert and oriented to person, place, and time.  Psychiatric:        Mood and Affect: Mood normal.        Behavior: Behavior normal.        Thought Content: Thought content normal.        Judgment: Judgment normal.     BP (!) 118/98 (BP Location: Left Arm, Patient Position: Sitting, Cuff Size: Large)   Pulse 84   Temp 98.8 F (37.1 C) (Oral)   Resp 18   Ht 6\' 2"  (1.88 m)   Wt 278 lb (126.1 kg)   SpO2 97%   BMI 35.69 kg/m  Wt Readings from Last 3 Encounters:  05/07/22 278 lb (126.1 kg)  02/27/22 279 lb 12.8 oz (126.9 kg)  02/06/22 280 lb 9.6 oz (127.3 kg)    Diabetic Foot Exam - Simple   No data filed    Lab Results  Component Value Date   WBC 6.5 01/25/2022   HGB 15.8 01/25/2022   HCT 46.4 01/25/2022   PLT 207.0 01/25/2022   GLUCOSE 81 01/25/2022   CHOL 206 (H) 01/25/2022   TRIG 146.0 01/25/2022   HDL 42.80 01/25/2022   LDLCALC 134 (H) 01/25/2022    ALT 30 01/25/2022   AST 20 01/25/2022   NA 138 01/25/2022   K 4.6 01/25/2022   CL 104 01/25/2022   CREATININE 1.10 01/25/2022   BUN 17 01/25/2022   CO2 27 01/25/2022   TSH 2.06 01/25/2022   PSA 1.76 11/29/2020    Lab Results  Component  Value Date   TSH 2.06 01/25/2022   Lab Results  Component Value Date   WBC 6.5 01/25/2022   HGB 15.8 01/25/2022   HCT 46.4 01/25/2022   MCV 91.7 01/25/2022   PLT 207.0 01/25/2022   Lab Results  Component Value Date   NA 138 01/25/2022   K 4.6 01/25/2022   CO2 27 01/25/2022   GLUCOSE 81 01/25/2022   BUN 17 01/25/2022   CREATININE 1.10 01/25/2022   BILITOT 0.4 01/25/2022   ALKPHOS 71 01/25/2022   AST 20 01/25/2022   ALT 30 01/25/2022   PROT 7.0 01/25/2022   ALBUMIN 4.5 01/25/2022   CALCIUM 9.5 01/25/2022   ANIONGAP 11 09/22/2015   GFR 82.29 01/25/2022   Lab Results  Component Value Date   CHOL 206 (H) 01/25/2022   Lab Results  Component Value Date   HDL 42.80 01/25/2022   Lab Results  Component Value Date   LDLCALC 134 (H) 01/25/2022   Lab Results  Component Value Date   TRIG 146.0 01/25/2022   Lab Results  Component Value Date   CHOLHDL 5 01/25/2022   No results found for: "HGBA1C"     Assessment & Plan:   Problem List Items Addressed This Visit       Unprioritized   Primary hypertension    Pt is off bp med F/u 2-3 weeks with his cuff to compare--- pt states his bp have been good at home        Other Visit Diagnoses     Upper back pain    -  Primary   Relevant Orders   Ambulatory referral to Chiropractic      No orders of the defined types were placed in this encounter.   IAnn Held, Berger, personally preformed the services described in this documentation.  All medical record entries made by the scribe were at my direction and in my presence.  I have reviewed the chart and discharge instructions (if applicable) and agree that the record reflects my personal performance and is accurate  and complete. 05/07/2022   I,Amber Collins,acting as a scribe for Levi Held, Berger.,have documented all relevant documentation on the behalf of Levi Held, Berger,as directed by  Levi Held, Berger while in the presence of Levi Held, Berger.    Levi Held, Berger

## 2022-05-07 NOTE — Patient Instructions (Signed)
Levi Berger--  massage--  [phone]

## 2022-05-07 NOTE — Assessment & Plan Note (Signed)
Pt is off bp med F/u 2-3 weeks with his cuff to compare--- pt states his bp have been good at home

## 2022-05-29 ENCOUNTER — Ambulatory Visit: Payer: 59

## 2022-05-29 NOTE — Progress Notes (Deleted)
Pt here for Blood pressure check per Dr. Zola Button.  Order on last ov 05/07/22  Pt takes no medication for this.  BP Readings from Last 3 Encounters:  05/07/22 (!) 118/98  02/27/22 (!) 150/100  02/06/22 (!) 160/100    BP today @ = HR =  Pt advised per

## 2022-07-28 ENCOUNTER — Other Ambulatory Visit: Payer: Self-pay | Admitting: Family Medicine

## 2022-07-28 DIAGNOSIS — I10 Essential (primary) hypertension: Secondary | ICD-10-CM

## 2022-08-13 ENCOUNTER — Encounter: Payer: Self-pay | Admitting: Family Medicine

## 2022-08-13 ENCOUNTER — Ambulatory Visit: Payer: 59 | Admitting: Family Medicine

## 2022-08-13 DIAGNOSIS — I10 Essential (primary) hypertension: Secondary | ICD-10-CM | POA: Diagnosis not present

## 2022-08-13 MED ORDER — LOSARTAN POTASSIUM-HCTZ 100-12.5 MG PO TABS: 1.0000 | ORAL_TABLET | Freq: Every day | ORAL | 1 refills | Status: DC

## 2022-08-13 NOTE — Progress Notes (Addendum)
Subjective:   By signing my name below, I, Marlana Latus, attest that this documentation has been prepared under the direction and in the presence of [name], DO 08/13/22   Patient ID: Levi Berger, male    DOB: 10-02-1980, 42 y.o.   MRN: [id number]  Chief Complaint  Patient presents with   Hypertension   Follow-up    HPI Patient is in today for a 3 month follow up.   He has been monitoring his blood pressures at home. His hypertension is well managed. He brought his blood pressure cuff with him today and confirmed accurate readings.   He was recently sick a couple weeks ago that he later learned was Covid. He has been feeling better and reported having mild symptoms when he was sick.   Past Medical History:  Diagnosis Date   Generalized anxiety disorder 12/26/2015   Palpitations     Past Surgical History:  Procedure Laterality Date   None     TRANSTHORACIC ECHOCARDIOGRAM  01/2012   Normal LV size and thickness. Normal wall motion with EF of 55-60%. Possible grade 2 diastolic dysfunction. Otherwise normal valve function.    Family History  Problem Relation Age of Onset   Atrial fibrillation Mother    Cancer Father    Lung cancer Father        Smoker   Hypertension Father    Diabetes Father    Heart disease Maternal Grandfather    Alcohol abuse Maternal Grandfather    Arthritis Paternal Grandfather    Colon cancer Paternal Grandfather    Diabetes Paternal Grandfather     Social History   Socioeconomic History   Marital status: Single    Spouse name: Not on file   Number of children: 0   Years of education: Not on file   Highest education level: Not on file  Occupational History   Occupation: Solicitor PD    Employer: UNEMPLOYED  Tobacco Use   Smoking status: Never    Passive exposure: Yes   Smokeless tobacco: Former   Tobacco comments:    pt occ smokes a pipe maybe once or twice a month  Vaping Use   Vaping Use: Never used  Substance  and Sexual Activity   Alcohol use: Yes    Alcohol/week: 5.0 standard drinks of alcohol    Types: 5 Standard drinks or equivalent per week    Comment: ocassionally   Drug use: No   Sexual activity: Not on file  Other Topics Concern   Not on file  Social History Narrative   Not on file   Social Determinants of Health   Financial Resource Strain: Not on file  Food Insecurity: Not on file  Transportation Needs: Not on file  Physical Activity: Not on file  Stress: Not on file  Social Connections: Not on file  Intimate Partner Violence: Not on file    Outpatient Medications Prior to Visit  Medication Sig Dispense Refill   Krill Oil 500 MG CAPS Take by mouth.     PARoxetine (PAXIL) 10 MG tablet Take 1 tablet (10 mg total) by mouth daily. 90 tablet 1   No facility-administered medications prior to visit.    No Known Allergies  Review of Systems  Constitutional:  Negative for fever and malaise/fatigue.  HENT:  Negative for congestion.   Eyes:  Negative for blurred vision.  Respiratory:  Negative for shortness of breath.   Cardiovascular:  Negative for chest pain, palpitations and leg  swelling.  Gastrointestinal:  Negative for abdominal pain, blood in stool and nausea.  Genitourinary:  Negative for dysuria and frequency.  Musculoskeletal:  Negative for falls.  Skin:  Negative for rash.  Neurological:  Negative for dizziness, loss of consciousness and headaches.  Endo/Heme/Allergies:  Negative for environmental allergies.  Psychiatric/Behavioral:  Negative for depression. The patient is not nervous/anxious.        Objective:    Physical Exam Vitals and nursing note reviewed.  Constitutional:      Appearance: He is well-developed.  HENT:     Head: Normocephalic and atraumatic.  Eyes:     Pupils: Pupils are equal, round, and reactive to light.  Neck:     Thyroid: No thyromegaly.  Cardiovascular:     Rate and Rhythm: Normal rate and regular rhythm.     Heart sounds:  No murmur heard. Pulmonary:     Effort: Pulmonary effort is normal. No respiratory distress.     Breath sounds: Normal breath sounds. No wheezing or rales.  Chest:     Chest wall: No tenderness.  Musculoskeletal:     Cervical back: Normal range of motion and neck supple.     Right hip: No tenderness. Normal range of motion. Normal strength.     Left hip: No tenderness. Normal range of motion. Normal strength.     Right foot: No swelling or bony tenderness.     Left foot: No swelling or bony tenderness.  Skin:    General: Skin is warm and dry.  Neurological:     Mental Status: He is alert and oriented to person, place, and time.  Psychiatric:        Behavior: Behavior normal.        Thought Content: Thought content normal.        Judgment: Judgment normal.     BP 120/80 (BP Location: Left Arm, Patient Position: Sitting, Cuff Size: Large)   Pulse 83   Temp 98.3 F (36.8 C) (Oral)   Resp 18   Ht 6' 2"$  (1.88 m)   Wt 282 lb 9.6 oz (128.2 kg)   SpO2 97%   BMI 36.28 kg/m  Wt Readings from Last 3 Encounters:  08/13/22 282 lb 9.6 oz (128.2 kg)  05/07/22 278 lb (126.1 kg)  02/27/22 279 lb 12.8 oz (126.9 kg)       Assessment & Plan:  Primary hypertension Assessment & Plan: Well controlled, no changes to meds. Encouraged heart healthy diet such as the DASH diet and exercise as tolerated.    Orders: -     Losartan Potassium-HCTZ; Take 1 tablet by mouth daily.  Dispense: 90 tablet; Refill: 1 -     CBC with Differential/Platelet -     Comprehensive metabolic panel -     Lipid panel -     TSH   I,Rachel Rivera,acting as a scribe for [name], DO.,have documented all relevant documentation on the behalf of [name], DO,as directed by  [name], DO while in the presence of [name], DO.   I, [name], DO, personally preformed the services described in this documentation.  All medical record entries made by the scribe  were at my direction and in my presence.  I have reviewed the chart and discharge instructions (if applicable) and agree that the record reflects my personal performance and is accurate and complete. 08/13/22   [name], DO

## 2022-08-13 NOTE — Assessment & Plan Note (Signed)
Well controlled, no changes to meds. Encouraged heart healthy diet such as the DASH diet and exercise as tolerated.  

## 2022-08-14 LAB — COMPREHENSIVE METABOLIC PANEL
ALT: 28 U/L (ref 0–53)
AST: 18 U/L (ref 0–37)
Albumin: 4.2 g/dL (ref 3.5–5.2)
Alkaline Phosphatase: 57 U/L (ref 39–117)
BUN: 13 mg/dL (ref 6–23)
CO2: 32 mEq/L (ref 19–32)
Calcium: 9.4 mg/dL (ref 8.4–10.5)
Chloride: 101 mEq/L (ref 96–112)
Creatinine, Ser: 1.11 mg/dL (ref 0.40–1.50)
GFR: 81.09 mL/min (ref >60.00)
Glucose, Bld: 90 mg/dL (ref 70–99)
Potassium: 4.2 mEq/L (ref 3.5–5.1)
Sodium: 139 mEq/L (ref 135–145)
Total Bilirubin: 0.5 mg/dL (ref 0.2–1.2)
Total Protein: 6.6 g/dL (ref 6.0–8.3)

## 2022-08-14 LAB — CBC WITH DIFFERENTIAL/PLATELET
Basophils Absolute: 0 10*3/uL (ref 0.0–0.1)
Basophils Relative: 0.5 % (ref 0.0–3.0)
Eosinophils Absolute: 0.2 10*3/uL (ref 0.0–0.7)
Eosinophils Relative: 5.6 % — ABNORMAL HIGH (ref 0.0–5.0)
HCT: 46.1 % (ref 39.0–52.0)
Hemoglobin: 15.7 g/dL (ref 13.0–17.0)
Lymphocytes Relative: 38.4 % (ref 12.0–46.0)
Lymphs Abs: 1.4 10*3/uL (ref 0.7–4.0)
MCHC: 34.2 g/dL (ref 30.0–36.0)
MCV: 91.6 fl (ref 78.0–100.0)
Monocytes Absolute: 0.4 10*3/uL (ref 0.1–1.0)
Monocytes Relative: 10 % (ref 3.0–12.0)
Neutro Abs: 1.7 10*3/uL (ref 1.4–7.7)
Neutrophils Relative %: 45.5 % (ref 43.0–77.0)
Platelets: 189 10*3/uL (ref 150.0–400.0)
RBC: 5.04 Mil/uL (ref 4.22–5.81)
RDW: 13.4 % (ref 11.5–15.5)
WBC: 3.6 10*3/uL — ABNORMAL LOW (ref 4.0–10.5)

## 2022-08-14 LAB — TSH: TSH: 1.25 u[IU]/mL (ref 0.35–5.50)

## 2022-08-14 LAB — LIPID PANEL
Cholesterol: 186 mg/dL (ref 0–200)
HDL: 32.2 mg/dL — ABNORMAL LOW (ref >39.00)
NonHDL: 154.19
Total CHOL/HDL Ratio: 6
Triglycerides: 278 mg/dL — ABNORMAL HIGH (ref 0.0–149.0)
VLDL: 55.6 mg/dL — ABNORMAL HIGH (ref 0.0–40.0)

## 2022-08-14 LAB — LDL CHOLESTEROL, DIRECT: Direct LDL: 123 mg/dL

## 2022-08-15 ENCOUNTER — Other Ambulatory Visit: Payer: Self-pay | Admitting: Family Medicine

## 2022-08-15 DIAGNOSIS — E785 Hyperlipidemia, unspecified: Secondary | ICD-10-CM

## 2022-12-04 ENCOUNTER — Other Ambulatory Visit: Payer: Self-pay | Admitting: Family Medicine

## 2022-12-04 DIAGNOSIS — F419 Anxiety disorder, unspecified: Secondary | ICD-10-CM

## 2023-02-11 ENCOUNTER — Ambulatory Visit: Payer: 59 | Admitting: Family Medicine

## 2023-02-11 ENCOUNTER — Encounter: Payer: Self-pay | Admitting: Family Medicine

## 2023-02-11 VITALS — BP 118/88 | HR 83 | Temp 98.8°F | Resp 18 | Ht 74.0 in | Wt 291.6 lb

## 2023-02-11 DIAGNOSIS — F418 Other specified anxiety disorders: Secondary | ICD-10-CM | POA: Insufficient documentation

## 2023-02-11 DIAGNOSIS — I10 Essential (primary) hypertension: Secondary | ICD-10-CM

## 2023-02-11 DIAGNOSIS — F419 Anxiety disorder, unspecified: Secondary | ICD-10-CM

## 2023-02-11 DIAGNOSIS — F411 Generalized anxiety disorder: Secondary | ICD-10-CM | POA: Diagnosis not present

## 2023-02-11 LAB — COMPREHENSIVE METABOLIC PANEL
ALT: 33 U/L (ref 0–53)
AST: 19 U/L (ref 0–37)
Albumin: 4.5 g/dL (ref 3.5–5.2)
Alkaline Phosphatase: 59 U/L (ref 39–117)
BUN: 18 mg/dL (ref 6–23)
CO2: 29 mEq/L (ref 19–32)
Calcium: 10 mg/dL (ref 8.4–10.5)
Chloride: 96 mEq/L (ref 96–112)
Creatinine, Ser: 1.15 mg/dL (ref 0.40–1.50)
GFR: 77.45 mL/min (ref >60.00)
Glucose, Bld: 93 mg/dL (ref 70–99)
Potassium: 4.5 mEq/L (ref 3.5–5.1)
Sodium: 132 mEq/L — ABNORMAL LOW (ref 135–145)
Total Bilirubin: 0.6 mg/dL (ref 0.2–1.2)
Total Protein: 7.1 g/dL (ref 6.0–8.3)

## 2023-02-11 LAB — CBC WITH DIFFERENTIAL/PLATELET
Basophils Absolute: 0.1 10*3/uL (ref 0.0–0.1)
Basophils Relative: 0.8 % (ref 0.0–3.0)
Eosinophils Absolute: 0.2 10*3/uL (ref 0.0–0.7)
Eosinophils Relative: 3 % (ref 0.0–5.0)
HCT: 48.4 % (ref 39.0–52.0)
Hemoglobin: 16.2 g/dL (ref 13.0–17.0)
Lymphocytes Relative: 24.3 % (ref 12.0–46.0)
Lymphs Abs: 1.5 10*3/uL (ref 0.7–4.0)
MCHC: 33.5 g/dL (ref 30.0–36.0)
MCV: 92.4 fl (ref 78.0–100.0)
Monocytes Absolute: 0.5 10*3/uL (ref 0.1–1.0)
Monocytes Relative: 8.2 % (ref 3.0–12.0)
Neutro Abs: 4 10*3/uL (ref 1.4–7.7)
Neutrophils Relative %: 63.7 % (ref 43.0–77.0)
Platelets: 227 10*3/uL (ref 150.0–400.0)
RBC: 5.24 Mil/uL (ref 4.22–5.81)
RDW: 13.3 % (ref 11.5–15.5)
WBC: 6.2 10*3/uL (ref 4.0–10.5)

## 2023-02-11 LAB — TSH: TSH: 2.1 u[IU]/mL (ref 0.35–5.50)

## 2023-02-11 LAB — LIPID PANEL
Cholesterol: 222 mg/dL — ABNORMAL HIGH (ref 0–200)
HDL: 37.9 mg/dL — ABNORMAL LOW (ref >39.00)
NonHDL: 183.96
Total CHOL/HDL Ratio: 6
Triglycerides: 203 mg/dL — ABNORMAL HIGH (ref 0.0–149.0)
VLDL: 40.6 mg/dL — ABNORMAL HIGH (ref 0.0–40.0)

## 2023-02-11 LAB — LDL CHOLESTEROL, DIRECT: Direct LDL: 160 mg/dL

## 2023-02-11 MED ORDER — LOSARTAN POTASSIUM 100 MG PO TABS: 100.0000 mg | ORAL_TABLET | Freq: Every day | ORAL | 3 refills | Status: DC

## 2023-02-11 MED ORDER — LOSARTAN POTASSIUM-HCTZ 100-12.5 MG PO TABS
1.0000 | ORAL_TABLET | Freq: Every day | ORAL | 3 refills | Status: DC
Start: 2023-02-11 — End: 2023-02-11

## 2023-02-11 MED ORDER — PAROXETINE HCL 10 MG PO TABS
10.0000 mg | ORAL_TABLET | Freq: Every day | ORAL | 3 refills | Status: DC
Start: 2023-02-11 — End: 2023-12-30

## 2023-02-11 NOTE — Assessment & Plan Note (Signed)
Well controlled, no changes to meds. Encouraged heart healthy diet such as the DASH diet and exercise as tolerated.   Pt requested we take the hct out of the losartan will change it for next refill

## 2023-02-11 NOTE — Assessment & Plan Note (Signed)
Stable Refill paxil

## 2023-02-11 NOTE — Progress Notes (Signed)
Established Patient Office Visit  Subjective   Patient ID: Levi Berger, male    DOB: [birth year]  Age: 42 y.o. MRN: [id number]  Chief Complaint  Patient presents with   Hypertension   Follow-up    HPI Discussed the use of AI scribe software for clinical note transcription with the patient, who gave verbal consent to proceed.  History of Present Illness   The patient, with a history of hypertension and anxiety, presents for medication refills. He reports that his blood pressure has been well controlled on Losartan with a diuretic, and his anxiety has been managed with Paxil. He has been obtaining his medications through [facility], which he finds convenient and cost-effective.  The patient also has a history of sleep apnea, but has not been using his CPAP machine due to his cats interfering with the mask. He has not noticed a significant difference in his sleep quality with or without the CPAP. He typically requires about ten hours of sleep to feel rested, but often does not achieve this due to his work schedule and being a night owl.  The patient also mentions a recent change in his work environment, with a Network engineer being promoted, which he believes has positively impacted his blood pressure. He is considering retiring in about fifteen years.       Patient Active Problem List   Diagnosis Date Noted   Anxiety 02/11/2023   Primary hypertension 02/10/2022   Acute right-sided low back pain with right-sided sciatica 02/10/2022   Lateral epicondylitis of right elbow 01/25/2022   Elevated BP without diagnosis of hypertension 11/29/2020   OSA (obstructive sleep apnea) 11/22/2016   Fatigue 09/11/2016   Blocked premature atrial contraction 05/17/2016   Generalized anxiety disorder 12/26/2015   Chronic fatigue 12/26/2015   B12 deficiency 11/07/2015   Dizziness 10/02/2015   Preventative health care 10/02/2015   Sciatica 06/02/2014   Past Medical History:  Diagnosis Date    Generalized anxiety disorder 12/26/2015   Palpitations    Past Surgical History:  Procedure Laterality Date   None     TRANSTHORACIC ECHOCARDIOGRAM  01/2012   Normal LV size and thickness. Normal wall motion with EF of 55-60%. Possible grade 2 diastolic dysfunction. Otherwise normal valve function.   Social History   Tobacco Use   Smoking status: Never    Passive exposure: Yes   Smokeless tobacco: Former   Tobacco comments:    pt occ smokes a pipe maybe once or twice a month  Vaping Use   Vaping status: Never Used  Substance Use Topics   Alcohol use: Yes    Alcohol/week: 5.0 standard drinks of alcohol    Types: 5 Standard drinks or equivalent per week    Comment: ocassionally   Drug use: No   Social History   Socioeconomic History   Marital status: Single    Spouse name: Not on file   Number of children: 0   Years of education: Not on file   Highest education level: Not on file  Occupational History   Occupation: [place] [employer]    Employer: UNEMPLOYED  Tobacco Use   Smoking status: Never    Passive exposure: Yes   Smokeless tobacco: Former   Tobacco comments:    pt occ smokes a pipe maybe once or twice a month  Vaping Use   Vaping status: Never Used  Substance and Sexual Activity   Alcohol use: Yes    Alcohol/week: 5.0 standard drinks of alcohol  Types: 5 Standard drinks or equivalent per week    Comment: ocassionally   Drug use: No   Sexual activity: Not on file  Other Topics Concern   Not on file  Social History Narrative   Not on file   Social Determinants of Health   Financial Resource Strain: Not on file  Food Insecurity: Not on file  Transportation Needs: Not on file  Physical Activity: Not on file  Stress: Not on file  Social Connections: Not on file  Intimate Partner Violence: Not on file   Family Status  Relation Name Status   Mother  Alive   Father  Deceased at age 87       lung cancer-- smoker   MGF  (Not Specified)   PGF  (Not  Specified)  No partnership data on file   Family History  Problem Relation Age of Onset   Atrial fibrillation Mother    Cancer Father    Lung cancer Father        Smoker   Hypertension Father    Diabetes Father    Heart disease Maternal Grandfather    Alcohol abuse Maternal Grandfather    Arthritis Paternal Grandfather    Colon cancer Paternal Grandfather    Diabetes Paternal Grandfather    No Known Allergies    Review of Systems  Constitutional:  Negative for fever and malaise/fatigue.  HENT:  Negative for congestion.   Eyes:  Negative for blurred vision.  Respiratory:  Negative for cough and shortness of breath.   Cardiovascular:  Negative for chest pain, palpitations and leg swelling.  Gastrointestinal:  Negative for vomiting.  Musculoskeletal:  Negative for back pain.  Skin:  Negative for rash.  Neurological:  Negative for loss of consciousness and headaches.      Objective:     BP 118/88 (BP Location: Left Arm, Patient Position: Sitting, Cuff Size: Large)   Pulse 83   Temp 98.8 F (37.1 C) (Oral)   Resp 18   Ht 6\' 2"  (1.88 m)   Wt 291 lb 9.6 oz (132.3 kg)   SpO2 97%   BMI 37.44 kg/m  BP Readings from Last 3 Encounters:  02/11/23 118/88  08/13/22 120/80  05/07/22 (!) 118/98   Wt Readings from Last 3 Encounters:  02/11/23 291 lb 9.6 oz (132.3 kg)  08/13/22 282 lb 9.6 oz (128.2 kg)  05/07/22 278 lb (126.1 kg)   SpO2 Readings from Last 3 Encounters:  02/11/23 97%  08/13/22 97%  05/07/22 97%      Physical Exam Vitals and nursing note reviewed.  Constitutional:      General: He is not in acute distress.    Appearance: Normal appearance. He is well-developed.  HENT:     Head: Normocephalic and atraumatic.  Eyes:     General: No scleral icterus.       Right eye: No discharge.        Left eye: No discharge.  Cardiovascular:     Rate and Rhythm: Normal rate and regular rhythm.     Heart sounds: No murmur heard. Pulmonary:     Effort: Pulmonary  effort is normal. No respiratory distress.     Breath sounds: Normal breath sounds.  Musculoskeletal:        General: Normal range of motion.     Cervical back: Normal range of motion and neck supple.     Right lower leg: No edema.     Left lower leg: No edema.  Skin:  General: Skin is warm and dry.  Neurological:     Mental Status: He is alert and oriented to person, place, and time.  Psychiatric:        Mood and Affect: Mood normal.        Behavior: Behavior normal.        Thought Content: Thought content normal.        Judgment: Judgment normal.      No results found for any visits on 02/11/23.  Last CBC Lab Results  Component Value Date   WBC 3.6 (L) 08/13/2022   HGB 15.7 08/13/2022   HCT 46.1 08/13/2022   MCV 91.6 08/13/2022   MCH 31.1 09/05/2018   RDW 13.4 08/13/2022   PLT 189.0 08/13/2022   Last metabolic panel Lab Results  Component Value Date   GLUCOSE 90 08/13/2022   NA 139 08/13/2022   K 4.2 08/13/2022   CL 101 08/13/2022   CO2 32 08/13/2022   BUN 13 08/13/2022   CREATININE 1.11 08/13/2022   GFR 81.09 08/13/2022   CALCIUM 9.4 08/13/2022   PROT 6.6 08/13/2022   ALBUMIN 4.2 08/13/2022   BILITOT 0.5 08/13/2022   ALKPHOS 57 08/13/2022   AST 18 08/13/2022   ALT 28 08/13/2022   ANIONGAP 11 09/22/2015   Last lipids Lab Results  Component Value Date   CHOL 186 08/13/2022   HDL 32.20 (L) 08/13/2022   LDLCALC 134 (H) 01/25/2022   LDLDIRECT 123.0 08/13/2022   TRIG 278.0 (H) 08/13/2022   CHOLHDL 6 08/13/2022   Last hemoglobin A1c No results found for: "HGBA1C" Last thyroid functions Lab Results  Component Value Date   TSH 1.25 08/13/2022   Last vitamin D No results found for: "25OHVITD2", "25OHVITD3", "VD25OH" Last vitamin B12 and Folate Lab Results  Component Value Date   VITAMINB12 798 12/08/2015      The 10-year ASCVD risk score (Arnett DK, et al., 2019) is: 2.8%    Assessment & Plan:   Problem List Items Addressed This Visit        Unprioritized   Primary hypertension    Well controlled, no changes to meds. Encouraged heart healthy diet such as the DASH diet and exercise as tolerated.   Pt requested we take the hct out of the losartan will change it for next refill      Relevant Medications   losartan (COZAAR) 100 MG tablet   Other Relevant Orders   CBC with Differential/Platelet   Comprehensive metabolic panel   Lipid panel   TSH   Generalized anxiety disorder - Primary    Stable  Refill paxil       Relevant Medications   PARoxetine (PAXIL) 10 MG tablet   Anxiety   Relevant Medications   PARoxetine (PAXIL) 10 MG tablet   Other Relevant Orders   TSH    No follow-ups on file.    [name], DO

## 2023-02-20 ENCOUNTER — Other Ambulatory Visit: Payer: Self-pay | Admitting: Family Medicine

## 2023-02-20 DIAGNOSIS — I10 Essential (primary) hypertension: Secondary | ICD-10-CM

## 2023-12-30 ENCOUNTER — Encounter: Payer: Self-pay | Admitting: Family Medicine

## 2023-12-30 ENCOUNTER — Ambulatory Visit (INDEPENDENT_AMBULATORY_CARE_PROVIDER_SITE_OTHER): Admitting: Family Medicine

## 2023-12-30 VITALS — BP 128/100 | HR 88 | Temp 98.1°F | Resp 18 | Ht 74.0 in | Wt 291.6 lb

## 2023-12-30 DIAGNOSIS — R21 Rash and other nonspecific skin eruption: Secondary | ICD-10-CM | POA: Insufficient documentation

## 2023-12-30 DIAGNOSIS — I10 Essential (primary) hypertension: Secondary | ICD-10-CM | POA: Diagnosis not present

## 2023-12-30 DIAGNOSIS — E785 Hyperlipidemia, unspecified: Secondary | ICD-10-CM | POA: Insufficient documentation

## 2023-12-30 DIAGNOSIS — Z0001 Encounter for general adult medical examination with abnormal findings: Secondary | ICD-10-CM

## 2023-12-30 DIAGNOSIS — F419 Anxiety disorder, unspecified: Secondary | ICD-10-CM

## 2023-12-30 DIAGNOSIS — Z0000 Encounter for general adult medical examination without abnormal findings: Secondary | ICD-10-CM

## 2023-12-30 DIAGNOSIS — Z1211 Encounter for screening for malignant neoplasm of colon: Secondary | ICD-10-CM

## 2023-12-30 LAB — COMPREHENSIVE METABOLIC PANEL WITH GFR
ALT: 27 U/L (ref 0–53)
AST: 16 U/L (ref 0–37)
Albumin: 4.5 g/dL (ref 3.5–5.2)
Alkaline Phosphatase: 70 U/L (ref 39–117)
BUN: 15 mg/dL (ref 6–23)
CO2: 28 meq/L (ref 19–32)
Calcium: 9.7 mg/dL (ref 8.4–10.5)
Chloride: 102 meq/L (ref 96–112)
Creatinine, Ser: 1.18 mg/dL (ref 0.40–1.50)
GFR: 74.63 mL/min (ref >60.00)
Glucose, Bld: 96 mg/dL (ref 70–99)
Potassium: 4.8 meq/L (ref 3.5–5.1)
Sodium: 138 meq/L (ref 135–145)
Total Bilirubin: 0.8 mg/dL (ref 0.2–1.2)
Total Protein: 7.1 g/dL (ref 6.0–8.3)

## 2023-12-30 LAB — CBC WITH DIFFERENTIAL/PLATELET
Basophils Absolute: 0.1 10*3/uL (ref 0.0–0.1)
Basophils Relative: 1 % (ref 0.0–3.0)
Eosinophils Absolute: 0.1 10*3/uL (ref 0.0–0.7)
Eosinophils Relative: 2.3 % (ref 0.0–5.0)
HCT: 48.6 % (ref 39.0–52.0)
Hemoglobin: 16.3 g/dL (ref 13.0–17.0)
Lymphocytes Relative: 19.5 % (ref 12.0–46.0)
Lymphs Abs: 1.1 10*3/uL (ref 0.7–4.0)
MCHC: 33.6 g/dL (ref 30.0–36.0)
MCV: 91.1 fl (ref 78.0–100.0)
Monocytes Absolute: 0.5 10*3/uL (ref 0.1–1.0)
Monocytes Relative: 9.5 % (ref 3.0–12.0)
Neutro Abs: 3.8 10*3/uL (ref 1.4–7.7)
Neutrophils Relative %: 67.7 % (ref 43.0–77.0)
Platelets: 217 10*3/uL (ref 150.0–400.0)
RBC: 5.34 Mil/uL (ref 4.22–5.81)
RDW: 13.1 % (ref 11.5–15.5)
WBC: 5.5 10*3/uL (ref 4.0–10.5)

## 2023-12-30 LAB — LIPID PANEL
Cholesterol: 203 mg/dL — ABNORMAL HIGH (ref 0–200)
HDL: 37.3 mg/dL — ABNORMAL LOW (ref >39.00)
LDL Cholesterol: 136 mg/dL — ABNORMAL HIGH (ref 0–99)
NonHDL: 165.46
Total CHOL/HDL Ratio: 5
Triglycerides: 145 mg/dL (ref 0.0–149.0)
VLDL: 29 mg/dL (ref 0.0–40.0)

## 2023-12-30 LAB — TSH: TSH: 1.52 u[IU]/mL (ref 0.35–5.50)

## 2023-12-30 LAB — PSA: PSA: 0.8 ng/mL (ref 0.10–4.00)

## 2023-12-30 MED ORDER — CLOTRIMAZOLE-BETAMETHASONE 1-0.05 % EX CREA: 1.0000 | TOPICAL_CREAM | Freq: Two times a day (BID) | CUTANEOUS | 0 refills | Status: DC

## 2023-12-30 MED ORDER — LOSARTAN POTASSIUM 100 MG PO TABS
100.0000 mg | ORAL_TABLET | Freq: Every day | ORAL | 3 refills | Status: AC
Start: 2023-12-30 — End: ?

## 2023-12-30 NOTE — Progress Notes (Signed)
 Established Patient Office Visit  Subjective   Patient ID: Levi Berger, male    DOB: 17-Nov-1980  Age: 43 y.o. MRN: [id number]  Chief Complaint  Patient presents with   Annual Exam    Pt states fasting. Pt states having a rash on lower back, going on several months     HPI Discussed the use of AI scribe software for clinical note transcription with the patient, who gave verbal consent to proceed.  History of Present Illness Levi Berger is a 43 year old male who presents with a persistent rash on his lower back.  He has a rash located on his lower back, just above and to the right of the gluteal crease. The duration of the rash is unspecified. Self-treatment with Lotrel, an antifungal, and alcohol swabs have been ineffective. The rash remains unchanged in size and appearance and is primarily symptomatic when he sweats. Exposure to saltwater during a week-long trip to the [place] [employer] did not result in any change.  He has a history of hypertension and takes his blood pressure medication at night, around 10:30 PM. He did not take his medication this morning. His blood pressure usually hovers around 130/87 mmHg when checked at home. He reports high blood pressure today and neck pain upon waking. No issues with joints currently.  He lives alone with two cats, occasionally smokes a pipe, and drinks alcohol occasionally. He is not sexually active. He mentions a potential job change that would involve less rotating shifts, which he finds appealing.  His mother is suffering from hearing loss but does not have dementia.   Patient Active Problem List   Diagnosis Date Noted   Hyperlipidemia 12/30/2023   Rash 12/30/2023   Anxiety 02/11/2023   Primary hypertension 02/10/2022   Acute right-sided low back pain with right-sided sciatica 02/10/2022   Lateral epicondylitis of right elbow 01/25/2022   Elevated BP without diagnosis of hypertension 11/29/2020   OSA (obstructive sleep  apnea) 11/22/2016   Fatigue 09/11/2016   Blocked premature atrial contraction 05/17/2016   Generalized anxiety disorder 12/26/2015   Chronic fatigue 12/26/2015   B12 deficiency 11/07/2015   Dizziness 10/02/2015   Colon cancer screening 10/02/2015   Sciatica 06/02/2014   Past Medical History:  Diagnosis Date   Generalized anxiety disorder 12/26/2015   Palpitations    Past Surgical History:  Procedure Laterality Date   None     TRANSTHORACIC ECHOCARDIOGRAM  01/2012   Normal LV size and thickness. Normal wall motion with EF of 55-60%. Possible grade 2 diastolic dysfunction. Otherwise normal valve function.   Social History   Tobacco Use   Smoking status: Never    Passive exposure: Yes   Smokeless tobacco: Former   Tobacco comments:    pt occ smokes a pipe maybe once or twice a month  Vaping Use   Vaping status: Never Used  Substance Use Topics   Alcohol use: Yes    Alcohol/week: 5.0 standard drinks of alcohol    Types: 5 Standard drinks or equivalent per week    Comment: ocassionally   Drug use: No   Social History   Socioeconomic History   Marital status: Single    Spouse name: Not on file   Number of children: 0   Years of education: Not on file   Highest education level: Bachelor's degree (e.g., BA, AB, BS)  Occupational History   Occupation: [place] [employer]    Employer: UNEMPLOYED  Tobacco Use   Smoking status:  Never    Passive exposure: Yes   Smokeless tobacco: Former   Tobacco comments:    pt occ smokes a pipe maybe once or twice a month  Vaping Use   Vaping status: Never Used  Substance and Sexual Activity   Alcohol use: Yes    Alcohol/week: 5.0 standard drinks of alcohol    Types: 5 Standard drinks or equivalent per week    Comment: ocassionally   Drug use: No   Sexual activity: Never    Partners: Female  Other Topics Concern   Not on file  Social History Narrative   Lives by himself with 2 cats    Social Drivers of Health   Financial Resource  Strain: Low Risk  (12/29/2023)   Overall Financial Resource Strain (CARDIA)    Difficulty of Paying Living Expenses: Not hard at all  Food Insecurity: No Food Insecurity (12/29/2023)   Hunger Vital Sign    Worried About Running Out of Food in the Last Year: Never true    Ran Out of Food in the Last Year: Never true  Transportation Needs: No Transportation Needs (12/29/2023)   PRAPARE - Administrator, Civil Service (Medical): No    Lack of Transportation (Non-Medical): No  Physical Activity: Insufficiently Active (12/29/2023)   Exercise Vital Sign    Days of Exercise per Week: 2 days    Minutes of Exercise per Session: 10 min  Stress: No Stress Concern Present (12/29/2023)   [employer] of Occupational Health - Occupational Stress Questionnaire    Feeling of Stress: Not at all  Social Connections: Socially Isolated (12/29/2023)   Social Connection and Isolation Panel    Frequency of Communication with Friends and Family: Never    Frequency of Social Gatherings with Friends and Family: Once a week    Attends Religious Services: Never    Database administrator or Organizations: Yes    Attends Engineer, structural: 1 to 4 times per year    Marital Status: Never married  Catering manager Violence: Not on file   Family Status  Relation Name Status   Mother  Alive   Father  Deceased at age 28       lung cancer-- smoker   MGF  (Not Specified)   PGF  (Not Specified)  No partnership data on file   Family History  Problem Relation Age of Onset   Atrial fibrillation Mother    Cancer Father    Lung cancer Father        Smoker   Hypertension Father    Diabetes Father    Heart disease Maternal Grandfather    Alcohol abuse Maternal Grandfather    Arthritis Paternal Grandfather    Colon cancer Paternal Grandfather    Diabetes Paternal Grandfather    No Known Allergies    Review of Systems  Constitutional:  Negative for chills, fever and malaise/fatigue.   HENT:  Negative for congestion and hearing loss.   Eyes:  Negative for blurred vision and discharge.  Respiratory:  Negative for cough, sputum production and shortness of breath.   Cardiovascular:  Negative for chest pain, palpitations and leg swelling.  Gastrointestinal:  Negative for abdominal pain, blood in stool, constipation, diarrhea, heartburn, nausea and vomiting.  Genitourinary:  Negative for dysuria, frequency, hematuria and urgency.  Musculoskeletal:  Negative for back pain, falls and myalgias.  Skin:  Negative for rash.  Neurological:  Negative for dizziness, sensory change, loss of consciousness, weakness and headaches.  Endo/Heme/Allergies:  Negative for environmental allergies. Does not bruise/bleed easily.  Psychiatric/Behavioral:  Negative for depression and suicidal ideas. The patient is not nervous/anxious and does not have insomnia.       Objective:     BP (!) 128/100   Pulse 88   Temp 98.1 F (36.7 C) (Oral)   Resp 18   Ht 6' 2 (1.88 m)   Wt 291 lb 9.6 oz (132.3 kg)   SpO2 98%   BMI 37.44 kg/m  BP Readings from Last 3 Encounters:  12/30/23 (!) 128/100  02/11/23 118/88  08/13/22 120/80   Wt Readings from Last 3 Encounters:  12/30/23 291 lb 9.6 oz (132.3 kg)  02/11/23 291 lb 9.6 oz (132.3 kg)  08/13/22 282 lb 9.6 oz (128.2 kg)   SpO2 Readings from Last 3 Encounters:  12/30/23 98%  02/11/23 97%  08/13/22 97%      Physical Exam Vitals and nursing note reviewed.  Constitutional:      General: He is not in acute distress.    Appearance: Normal appearance. He is well-developed.  HENT:     Head: Normocephalic and atraumatic.     Right Ear: Tympanic membrane, ear canal and external ear normal. There is no impacted cerumen.     Left Ear: Tympanic membrane, ear canal and external ear normal. There is no impacted cerumen.     Nose: Nose normal.     Mouth/Throat:     Mouth: Mucous membranes are moist.     Pharynx: Oropharynx is clear. No  oropharyngeal exudate or posterior oropharyngeal erythema.   Eyes:     General: No scleral icterus.       Right eye: No discharge.        Left eye: No discharge.     Conjunctiva/sclera: Conjunctivae normal.     Pupils: Pupils are equal, round, and reactive to light.   Neck:     Thyroid : No thyromegaly.     Vascular: No JVD.   Cardiovascular:     Rate and Rhythm: Normal rate and regular rhythm.     Heart sounds: Normal heart sounds. No murmur heard. Pulmonary:     Effort: Pulmonary effort is normal. No respiratory distress.     Breath sounds: Normal breath sounds.  Abdominal:     General: Bowel sounds are normal. There is no distension.     Palpations: Abdomen is soft. There is no mass.     Tenderness: There is no abdominal tenderness. There is no guarding or rebound.   Musculoskeletal:        General: Normal range of motion.     Cervical back: Normal range of motion and neck supple.     Right lower leg: No edema.     Left lower leg: No edema.  Lymphadenopathy:     Cervical: No cervical adenopathy.   Skin:    General: Skin is warm and dry.     Findings: Erythema and rash present. No lesion. Rash is macular and scaling.       Neurological:     General: No focal deficit present.     Mental Status: He is alert and oriented to person, place, and time.     Cranial Nerves: No cranial nerve deficit.     Motor: No abnormal muscle tone.     Deep Tendon Reflexes: Reflexes are normal and symmetric. Reflexes normal.   Psychiatric:        Mood and Affect: Mood normal.  Behavior: Behavior normal.        Thought Content: Thought content normal.        Judgment: Judgment normal.      No results found for any visits on 12/30/23.  Last CBC Lab Results  Component Value Date   WBC 6.2 02/11/2023   HGB 16.2 02/11/2023   HCT 48.4 02/11/2023   MCV 92.4 02/11/2023   MCH 31.1 09/05/2018   RDW 13.3 02/11/2023   PLT 227.0 02/11/2023   Last metabolic panel Lab Results   Component Value Date   GLUCOSE 93 02/11/2023   NA 132 (L) 02/11/2023   K 4.5 02/11/2023   CL 96 02/11/2023   CO2 29 02/11/2023   BUN 18 02/11/2023   CREATININE 1.15 02/11/2023   GFR 77.45 02/11/2023   CALCIUM  10.0 02/11/2023   PROT 7.1 02/11/2023   ALBUMIN 4.5 02/11/2023   BILITOT 0.6 02/11/2023   ALKPHOS 59 02/11/2023   AST 19 02/11/2023   ALT 33 02/11/2023   ANIONGAP 11 09/22/2015   Last lipids Lab Results  Component Value Date   CHOL 222 (H) 02/11/2023   HDL 37.90 (L) 02/11/2023   LDLCALC 134 (H) 01/25/2022   LDLDIRECT 160.0 02/11/2023   TRIG 203.0 (H) 02/11/2023   CHOLHDL 6 02/11/2023   Last hemoglobin A1c No results found for: HGBA1C Last thyroid  functions Lab Results  Component Value Date   TSH 2.10 02/11/2023   Last vitamin D No results found for: MARIEN BOLLS, VD25OH Last vitamin B12 and Folate Lab Results  Component Value Date   VITAMINB12 798 12/08/2015      The 10-year ASCVD risk score (Arnett DK, et al., 2019) is: 4%    Assessment & Plan:   Problem List Items Addressed This Visit       Unprioritized   Colon cancer screening - Primary   Relevant Orders   Ambulatory referral to Gastroenterology   Primary hypertension   Relevant Medications   losartan  (COZAAR ) 100 MG tablet   Other Relevant Orders   TSH   Comprehensive metabolic panel with GFR   CBC with Differential/Platelet   Anxiety   Rash   Relevant Medications   clotrimazole-betamethasone (LOTRISONE) cream   Hyperlipidemia   Relevant Medications   losartan  (COZAAR ) 100 MG tablet   Other Relevant Orders   Lipid panel   PSA   TSH   Comprehensive metabolic panel with GFR  Assessment and Plan Assessment & Plan Rash   A persistent rash on the lower back, above and to the right of the gluteal crease, remains unresponsive to antifungal treatment (Lotrel) or saltwater exposure. It is asymptomatic except during sweating. The differential diagnosis includes a fungal  infection or dermatitis. Prescribe a combination antifungal and cortisone cream and send the prescription to the local pharmacy.  Hypertension   Blood pressure was elevated during the visit. He reports taking his medication at night instead of in the morning. Home readings are typically around 130/87 mmHg. Today's elevated reading may be influenced by neck pain upon waking. Recheck blood pressure after he sits for a minute and proceed to the lab for further evaluation.  General Health Maintenance   Discussed colon cancer screening due to his age of 82. Screening is recommended for early detection of colorectal cancer. Discuss colon cancer screening options.    Return in about 3 weeks (around 01/20/2024), or if symptoms worsen or fail to improve, for bp check .    Aulton Routt R Lowne Chase, DO

## 2023-12-30 NOTE — Patient Instructions (Signed)

## 2024-01-04 ENCOUNTER — Encounter: Payer: Self-pay | Admitting: Family Medicine

## 2024-01-06 ENCOUNTER — Ambulatory Visit: Payer: Self-pay | Admitting: Family Medicine

## 2024-01-20 ENCOUNTER — Ambulatory Visit (INDEPENDENT_AMBULATORY_CARE_PROVIDER_SITE_OTHER)

## 2024-01-20 DIAGNOSIS — I10 Essential (primary) hypertension: Secondary | ICD-10-CM

## 2024-01-20 MED ORDER — HYDROCHLOROTHIAZIDE 12.5 MG PO CAPS: 12.5000 mg | ORAL_CAPSULE | Freq: Every day | ORAL | 1 refills | Status: DC

## 2024-01-20 NOTE — Progress Notes (Signed)
 Pt here for Blood pressure check per Lowne  Pt currently takes: Losartan  100mg , once daily   Pt reports compliance with medication.  BP today @ = 144/100 HR =97   BP recheck: 148/108   Pt advised per Dr. Watt to start hydrochlorothiazide  12.5mg  and to f/u with Lowne in 1 month.  BP Readings from Last 3 Encounters:  12/30/23 (!) 128/100  02/11/23 118/88  08/13/22 120/80

## 2024-01-22 ENCOUNTER — Other Ambulatory Visit: Payer: Self-pay | Admitting: Family Medicine

## 2024-01-22 DIAGNOSIS — F419 Anxiety disorder, unspecified: Secondary | ICD-10-CM

## 2024-02-21 ENCOUNTER — Encounter: Payer: Self-pay | Admitting: Family Medicine

## 2024-02-21 ENCOUNTER — Ambulatory Visit: Admitting: Family Medicine

## 2024-02-21 VITALS — BP 136/86 | HR 91 | Temp 98.3°F | Resp 18 | Ht 74.0 in | Wt 281.4 lb

## 2024-02-21 DIAGNOSIS — F419 Anxiety disorder, unspecified: Secondary | ICD-10-CM | POA: Diagnosis not present

## 2024-02-21 DIAGNOSIS — I10 Essential (primary) hypertension: Secondary | ICD-10-CM

## 2024-02-21 DIAGNOSIS — F418 Other specified anxiety disorders: Secondary | ICD-10-CM

## 2024-02-21 MED ORDER — PAROXETINE HCL 10 MG PO TABS: 10.0000 mg | ORAL_TABLET | Freq: Every day | ORAL | 3 refills | Status: AC

## 2024-02-21 NOTE — Assessment & Plan Note (Signed)
 Well controlled, no changes to meds. Encouraged heart healthy diet such as the DASH diet and exercise as tolerated.

## 2024-02-21 NOTE — Progress Notes (Signed)
 Subjective:    Patient ID: Levi Berger, male    DOB: 1980-07-19, 43 y.o.   MRN: [id number]  Chief Complaint  Patient presents with   Hypertension   Follow-up    HPI Patient is in today for f/u.  Discussed the use of AI scribe software for clinical note transcription with the patient, who gave verbal consent to proceed.  History of Present Illness Levi Berger is a 43 year old male with hypertension who presents for medication management and follow-up.  He is currently taking paroxetine  and a diuretic to manage his blood pressure, which typically reads in the 120s/80s at home. Occasionally, his blood pressure drops to 117/78 mmHg or fluctuates to the 120s/80s, causing lightheadedness upon standing.  He has been working out regularly and maintains a normal sleep schedule, which he believes contributes to his overall health. 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  He has tried non-pharmacological approaches such as mindfulness and exercise to manage his condition but found them insufficient, leading to his current medication use. He monitors his blood pressure at home but did not check it on the day of the visit.  ]  Past Medical History:  Diagnosis Date   Generalized anxiety disorder 12/26/2015   Palpitations     Past Surgical History:  Procedure Laterality Date   None     TRANSTHORACIC ECHOCARDIOGRAM  01/2012   Normal LV size and thickness. Normal wall motion with EF of 55-60%. Possible grade 2 diastolic dysfunction. Otherwise normal valve function.    Family History  Problem Relation Age of Onset   Atrial fibrillation Mother    Cancer Father    Lung cancer Father        Smoker   Hypertension Father    Diabetes Father    Heart disease Maternal  Grandfather    Alcohol abuse Maternal Grandfather    Arthritis Paternal Grandfather    Colon cancer Paternal Grandfather    Diabetes Paternal Grandfather     Social History   Socioeconomic History   Marital status: Single    Spouse name: Not on file   Number of children: 0   Years of education: Not on file   Highest education level: Bachelor's degree (e.g., BA, AB, BS)  Occupational History   Occupation: [employer]    Employer: UNEMPLOYED  Tobacco Use   Smoking status: Never    Passive exposure: Yes   Smokeless tobacco: Former   Tobacco comments:    pt occ smokes a pipe maybe once or twice a month  Vaping Use   Vaping status: Never Used  Substance and Sexual Activity   Alcohol use: Yes    Alcohol/week: 5.0 standard drinks of alcohol    Types: 5 Standard drinks or equivalent per week    Comment: ocassionally   Drug use: No   Sexual activity: Never    Partners: Female  Other Topics Concern   Not on file  Social History Narrative   Lives by himself with 2 cats    Social Drivers of Health   Financial Resource Strain: Low Risk  (12/29/2023)   Overall Financial Resource Strain (CARDIA)    Difficulty of Paying Living  Expenses: Not hard at all  Food Insecurity: No Food Insecurity (12/29/2023)   Hunger Vital Sign    Worried About Running Out of Food in the Last Year: Never true    Ran Out of Food in the Last Year: Never true  Transportation Needs: No Transportation Needs (12/29/2023)   PRAPARE - Administrator, Civil Service (Medical): No    Lack of Transportation (Non-Medical): No  Physical Activity: Insufficiently Active (12/29/2023)   Exercise Vital Sign    Days of Exercise per Week: 2 days    Minutes of Exercise per Session: 10 min  Stress: No Stress Concern Present (12/29/2023)   [facility] [employer]ational Health - Occupational Stress Questionnaire    Feeling of Stress: Not at all  Social Connections: Socially Isolated (12/29/2023)   Social  Connection and Isolation Panel    Frequency of Communication with Friends and Family: Never    Frequency of Social Gatherings with Friends and Family: Once a week    Attends Religious Services: Never    Database administrator or Organizations: Yes    Attends Engineer, structural: 1 to 4 times per year    Marital Status: Never married  Intimate Partner Violence: Not on file    Outpatient Medications Prior to Visit  Medication Sig Dispense Refill   hydrochlorothiazide  (MICROZIDE ) 12.5 MG capsule Take 1 capsule (12.5 mg total) by mouth daily. 90 capsule 1   Krill Oil 500 MG CAPS Take by mouth.     losartan  (COZAAR ) 100 MG tablet Take 1 tablet (100 mg total) by mouth daily. 90 tablet 3   PAROXETINE  HCL 10 mg.     clotrimazole -betamethasone  (LOTRISONE ) cream Apply 1 Application topically 2 (two) times daily. 30 g 0   No facility-administered medications prior to visit.    No Known Allergies  Review of Systems  Constitutional:  Negative for chills, fever and malaise/fatigue.  HENT:  Negative for congestion and hearing loss.   Eyes:  Negative for blurred vision and discharge.  Respiratory:  Negative for cough, sputum production and shortness of breath.   Cardiovascular:  Negative for chest pain, palpitations and leg swelling.  Gastrointestinal:  Negative for abdominal pain, blood in stool, constipation, diarrhea, heartburn, nausea and vomiting.  Genitourinary:  Negative for dysuria, frequency, hematuria and urgency.  Musculoskeletal:  Negative for back pain, falls and myalgias.  Skin:  Negative for rash.  Neurological:  Negative for dizziness, sensory change, loss of consciousness, weakness and headaches.  Endo/Heme/Allergies:  Negative for environmental allergies. Does not bruise/bleed easily.  Psychiatric/Behavioral:  Negative for depression and suicidal ideas. The patient is not nervous/anxious and does not have insomnia.        Objective:    Physical Exam Vitals and  nursing note reviewed.  Constitutional:      General: He is not in acute distress.    Appearance: Normal appearance. He is well-developed.  HENT:     Head: Normocephalic and atraumatic.     Right Ear: Tympanic membrane, ear canal and external ear normal. There is no impacted cerumen.     Left Ear: Tympanic membrane, ear canal and external ear normal. There is no impacted cerumen.     Nose: Nose normal.     Mouth/Throat:     Mouth: Mucous membranes are moist.     Pharynx: Oropharynx is clear. No oropharyngeal exudate or posterior oropharyngeal erythema.  Eyes:     General: No scleral icterus.  Right eye: No discharge.        Left eye: No discharge.     Conjunctiva/sclera: Conjunctivae normal.     Pupils: Pupils are equal, round, and reactive to light.  Neck:     Thyroid : No thyromegaly.     Vascular: No JVD.  Cardiovascular:     Rate and Rhythm: Normal rate and regular rhythm.     Heart sounds: Normal heart sounds. No murmur heard. Pulmonary:     Effort: Pulmonary effort is normal. No respiratory distress.     Breath sounds: Normal breath sounds.  Abdominal:     General: Bowel sounds are normal. There is no distension.     Palpations: Abdomen is soft. There is no mass.     Tenderness: There is no abdominal tenderness. There is no guarding or rebound.  Musculoskeletal:        General: Normal range of motion.     Cervical back: Normal range of motion and neck supple.     Right lower leg: No edema.     Left lower leg: No edema.  Lymphadenopathy:     Cervical: No cervical adenopathy.  Skin:    General: Skin is warm and dry.     Findings: No erythema or rash.  Neurological:     Mental Status: He is alert and oriented to person, place, and time.     Cranial Nerves: No cranial nerve deficit.     Motor: No abnormal muscle tone.     Deep Tendon Reflexes: Reflexes are normal and symmetric. Reflexes normal.  Psychiatric:        Mood and Affect: Mood normal.        Behavior:  Behavior normal.        Thought Content: Thought content normal.        Judgment: Judgment normal.     BP 136/86 (BP Location: Right Arm, Patient Position: Sitting, Cuff Size: Large)   Pulse 91   Temp 98.3 F (36.8 C) (Oral)   Resp 18   Ht 6' 2 (1.88 m)   Wt 281 lb 6.4 oz (127.6 kg)   SpO2 97%   BMI 36.13 kg/m  Wt Readings from Last 3 Encounters:  02/21/24 281 lb 6.4 oz (127.6 kg)  12/30/23 291 lb 9.6 oz (132.3 kg)  02/11/23 291 lb 9.6 oz (132.3 kg)    Diabetic Foot Exam - Simple   No data filed    Lab Results  Component Value Date   WBC 5.5 12/30/2023   HGB 16.3 12/30/2023   HCT 48.6 12/30/2023   PLT 217.0 12/30/2023   GLUCOSE 96 12/30/2023   CHOL 203 (H) 12/30/2023   TRIG 145.0 12/30/2023   HDL 37.30 (L) 12/30/2023   LDLDIRECT 160.0 02/11/2023   LDLCALC 136 (H) 12/30/2023   ALT 27 12/30/2023   AST 16 12/30/2023   NA 138 12/30/2023   K 4.8 12/30/2023   CL 102 12/30/2023   CREATININE 1.18 12/30/2023   BUN 15 12/30/2023   CO2 28 12/30/2023   TSH 1.52 12/30/2023   PSA 0.80 12/30/2023    Lab Results  Component Value Date   TSH 1.52 12/30/2023   Lab Results  Component Value Date   WBC 5.5 12/30/2023   HGB 16.3 12/30/2023   HCT 48.6 12/30/2023   MCV 91.1 12/30/2023   PLT 217.0 12/30/2023   Lab Results  Component Value Date   NA 138 12/30/2023   K 4.8 12/30/2023   CO2 28 12/30/2023  GLUCOSE 96 12/30/2023   BUN 15 12/30/2023   CREATININE 1.18 12/30/2023   BILITOT 0.8 12/30/2023   ALKPHOS 70 12/30/2023   AST 16 12/30/2023   ALT 27 12/30/2023   PROT 7.1 12/30/2023   ALBUMIN 4.5 12/30/2023   CALCIUM  9.7 12/30/2023   ANIONGAP 11 09/22/2015   GFR 74.63 12/30/2023   Lab Results  Component Value Date   CHOL 203 (H) 12/30/2023   Lab Results  Component Value Date   HDL 37.30 (L) 12/30/2023   Lab Results  Component Value Date   LDLCALC 136 (H) 12/30/2023   Lab Results  Component Value Date   TRIG 145.0 12/30/2023   Lab Results   Component Value Date   CHOLHDL 5 12/30/2023   No results found for: HGBA1C     Assessment & Plan:  Anxiety -     PARoxetine  HCl; Take 1 tablet (10 mg total) by mouth daily.  Dispense: 90 tablet; Refill: 3  Primary hypertension Assessment & Plan: Well controlled, no changes to meds. Encouraged heart healthy diet such as the Levi diet and exercise as tolerated.      Assessment and Plan Assessment & Plan Hypertension   Hypertension is well-managed with home readings averaging 130/80s, occasionally dropping to 117/78 or 120s/80s. Lightheadedness upon standing suggests possible overmedication, potentially exacerbated by paroxetine , regular exercise, and a normal sleep schedule. Discontinue diuretic medication due to inconvenience and interference with his work schedule.  Major depressive disorder   Major depressive disorder is managed with paroxetine . Mindfulness and exercise were ineffective. He prefers medication to mitigate risks of hypertension and potential stroke, understanding the balance between benefits and side effects. Continue paroxetine  prescription.   Annaly Skop R Lowne Chase, DO

## 2024-02-21 NOTE — Assessment & Plan Note (Signed)
 Cont paxil

## 2024-07-13 ENCOUNTER — Ambulatory Visit: Admitting: Family Medicine

## 2024-07-13 ENCOUNTER — Encounter: Payer: Self-pay | Admitting: Family Medicine

## 2024-07-13 VITALS — BP 132/88 | HR 87 | Temp 98.0°F | Resp 18 | Ht 74.0 in | Wt 294.4 lb

## 2024-07-13 DIAGNOSIS — F418 Other specified anxiety disorders: Secondary | ICD-10-CM | POA: Diagnosis not present

## 2024-07-13 DIAGNOSIS — I10 Essential (primary) hypertension: Secondary | ICD-10-CM

## 2024-07-13 DIAGNOSIS — F411 Generalized anxiety disorder: Secondary | ICD-10-CM | POA: Diagnosis not present

## 2024-07-13 DIAGNOSIS — E785 Hyperlipidemia, unspecified: Secondary | ICD-10-CM

## 2024-07-13 LAB — COMPREHENSIVE METABOLIC PANEL WITH GFR
ALT: 25 U/L (ref 3–53)
AST: 15 U/L (ref 5–37)
Albumin: 4.3 g/dL (ref 3.5–5.2)
Alkaline Phosphatase: 67 U/L (ref 39–117)
BUN: 17 mg/dL (ref 6–23)
CO2: 31 meq/L (ref 19–32)
Calcium: 9.4 mg/dL (ref 8.4–10.5)
Chloride: 101 meq/L (ref 96–112)
Creatinine, Ser: 1.17 mg/dL (ref 0.40–1.50)
GFR: 75.11 mL/min
Glucose, Bld: 81 mg/dL (ref 70–99)
Potassium: 4 meq/L (ref 3.5–5.1)
Sodium: 137 meq/L (ref 135–145)
Total Bilirubin: 0.6 mg/dL (ref 0.2–1.2)
Total Protein: 6.9 g/dL (ref 6.0–8.3)

## 2024-07-13 LAB — LIPID PANEL
Cholesterol: 176 mg/dL (ref 28–200)
HDL: 38.6 mg/dL — ABNORMAL LOW
LDL Cholesterol: 111 mg/dL — ABNORMAL HIGH (ref 10–99)
NonHDL: 137.47
Total CHOL/HDL Ratio: 5
Triglycerides: 131 mg/dL (ref 10.0–149.0)
VLDL: 26.2 mg/dL (ref 0.0–40.0)

## 2024-07-13 LAB — CBC WITH DIFFERENTIAL/PLATELET
Basophils Absolute: 0 K/uL (ref 0.0–0.1)
Basophils Relative: 1 % (ref 0.0–3.0)
Eosinophils Absolute: 0.1 K/uL (ref 0.0–0.7)
Eosinophils Relative: 2.2 % (ref 0.0–5.0)
HCT: 46.1 % (ref 39.0–52.0)
Hemoglobin: 15.7 g/dL (ref 13.0–17.0)
Lymphocytes Relative: 19.8 % (ref 12.0–46.0)
Lymphs Abs: 1 K/uL (ref 0.7–4.0)
MCHC: 34 g/dL (ref 30.0–36.0)
MCV: 91.2 fl (ref 78.0–100.0)
Monocytes Absolute: 0.4 K/uL (ref 0.1–1.0)
Monocytes Relative: 8.2 % (ref 3.0–12.0)
Neutro Abs: 3.5 K/uL (ref 1.4–7.7)
Neutrophils Relative %: 68.8 % (ref 43.0–77.0)
Platelets: 207 K/uL (ref 150.0–400.0)
RBC: 5.06 Mil/uL (ref 4.22–5.81)
RDW: 12.9 % (ref 11.5–15.5)
WBC: 5.1 K/uL (ref 4.0–10.5)

## 2024-07-13 LAB — TSH: TSH: 1.48 u[IU]/mL (ref 0.35–5.50)

## 2024-07-13 MED ORDER — HYDROCHLOROTHIAZIDE 12.5 MG PO CAPS: 12.5000 mg | ORAL_CAPSULE | Freq: Every day | ORAL | 3 refills | Status: AC

## 2024-07-13 NOTE — Assessment & Plan Note (Signed)
 Repeat bp was 138/88 Dash diet  Pt will start exercising  F/u 3 months or sooner as needed

## 2024-07-13 NOTE — Progress Notes (Signed)
 "  Subjective:    Patient ID: Levi Berger, male    DOB: Jul 29, 1980, 44 y.o.   MRN: [id number]  Chief Complaint  Patient presents with   Hypertension   Anxiety   Follow-up    HPI Patient is in today for f/u anxiety and bp.  Discussed the use of AI scribe software for clinical note transcription with the patient, who gave verbal consent to proceed.  History of Present Illness Levi Berger is a 44 year old male with hypertension who presents with elevated blood pressure and anxiety.  He has been experiencing elevated blood pressure readings, typically in the 130s/80s range at home. He takes his blood pressure medication in the evening and confirms taking it yesterday. He reports experiencing anxiety and back pain.  He describes feeling 'worn out and beat up' after experiencing food poisoning during a training trip to [place]  last week. He continues to feel somewhat unwell since the episode of food poisoning.  He reports an increase in anxiety, which he believes may be contributing to his elevated blood pressure. He is stressed due to one of his cats having health issues, which has been an additional source of anxiety.  He mentions back pain and has thought about using spinal traction. He owns a traction table but has not been using it recently.  No leg swelling. He has been less active during the holidays but plans to resume exercising at least 30 minutes a day, four days a week, using the gym facilities available at his workplace.    Past Medical History:  Diagnosis Date   Generalized anxiety disorder 12/26/2015   Palpitations     Past Surgical History:  Procedure Laterality Date   None     TRANSTHORACIC ECHOCARDIOGRAM  01/2012   Normal LV size and thickness. Normal wall motion with EF of 55-60%. Possible grade 2 diastolic dysfunction. Otherwise normal valve function.    Family History  Problem Relation Age of Onset   Atrial fibrillation Mother    Cancer Father     Lung cancer Father        Smoker   Hypertension Father    Diabetes Father    Heart disease Maternal Grandfather    Alcohol abuse Maternal Grandfather    Arthritis Paternal Grandfather    Colon cancer Paternal Grandfather    Diabetes Paternal Grandfather     Social History   Socioeconomic History   Marital status: Single    Spouse name: Not on file   Number of children: 0   Years of education: Not on file   Highest education level: Bachelor's degree (e.g., BA, AB, BS)  Occupational History   Occupation: [employer]    Employer: UNEMPLOYED  Tobacco Use   Smoking status: Never    Passive exposure: Yes   Smokeless tobacco: Former   Tobacco comments:    pt occ smokes a pipe maybe once or twice a month  Vaping Use   Vaping status: Never Used  Substance and Sexual Activity   Alcohol use: Yes    Alcohol/week: 5.0 standard drinks of alcohol    Types: 5 Standard drinks or equivalent per week    Comment: ocassionally   Drug use: No   Sexual activity: Never    Partners: Female  Other Topics Concern   Not on file  Social History Narrative   Lives by himself with 2 cats    Social Drivers of Health   Tobacco Use: Medium Risk (07/13/2024)  Patient History    Smoking Tobacco Use: Never    Smokeless Tobacco Use: Former    Passive Exposure: Yes  Programmer, Applications: Low Risk (07/12/2024)   Overall Financial Resource Strain (CARDIA)    Difficulty of Paying Living Expenses: Not hard at all  Food Insecurity: No Food Insecurity (07/12/2024)   Epic    Worried About Programme Researcher, Broadcasting/film/video in the Last Year: Never true    Ran Out of Food in the Last Year: Never true  Transportation Needs: No Transportation Needs (07/12/2024)   Epic    Lack of Transportation (Medical): No    Lack of Transportation (Non-Medical): No  Physical Activity: Insufficiently Active (07/12/2024)   Exercise Vital Sign    Days of Exercise per Week: 3 days    Minutes of Exercise per Session: 30 min   Stress: No Stress Concern Present (07/12/2024)   [employer] of Occupational Health - Occupational Stress Questionnaire    Feeling of Stress: Only a little  Social Connections: Socially Isolated (07/12/2024)   Social Connection and Isolation Panel    Frequency of Communication with Friends and Family: Three times a week    Frequency of Social Gatherings with Friends and Family: Once a week    Attends Religious Services: Never    Database Administrator or Organizations: No    Attends Engineer, Structural: Not on file    Marital Status: Never married  Intimate Partner Violence: Not on file  Depression (PHQ2-9): Low Risk (07/13/2024)   Depression (PHQ2-9)    PHQ-2 Score: 0  Alcohol Screen: Low Risk (07/12/2024)   Alcohol Screen    Last Alcohol Screening Score (AUDIT): 1  Housing: Low Risk (07/12/2024)   Epic    Unable to Pay for Housing in the Last Year: No    Number of Times Moved in the Last Year: 0    Homeless in the Last Year: No  Utilities: Not on file  Health Literacy: Not on file    Outpatient Medications Prior to Visit  Medication Sig Dispense Refill   Krill Oil 500 MG CAPS Take by mouth.     losartan  (COZAAR ) 100 MG tablet Take 1 tablet (100 mg total) by mouth daily. 90 tablet 3   PARoxetine  (PAXIL ) 10 MG tablet Take 1 tablet (10 mg total) by mouth daily. 90 tablet 3   PAROXETINE  HCL 10 mg.     hydrochlorothiazide  (MICROZIDE ) 12.5 MG capsule Take 1 capsule (12.5 mg total) by mouth daily. 90 capsule 1   No facility-administered medications prior to visit.    No Known Allergies  Review of Systems  Constitutional:  Negative for chills, fever and malaise/fatigue.  HENT:  Negative for congestion and hearing loss.   Eyes:  Negative for discharge.  Respiratory:  Negative for cough, sputum production and shortness of breath.   Cardiovascular:  Negative for chest pain, palpitations and leg swelling.  Gastrointestinal:  Negative for abdominal pain, blood in  stool, constipation, diarrhea, heartburn, nausea and vomiting.  Genitourinary:  Negative for dysuria, frequency, hematuria and urgency.  Musculoskeletal:  Negative for back pain, falls and myalgias.  Skin:  Negative for rash.  Neurological:  Negative for dizziness, sensory change, loss of consciousness, weakness and headaches.  Endo/Heme/Allergies:  Negative for environmental allergies. Does not bruise/bleed easily.  Psychiatric/Behavioral:  Negative for depression and suicidal ideas. The patient is nervous/anxious. The patient does not have insomnia.        Objective:    Physical Exam Vitals  and nursing note reviewed.  Constitutional:      General: He is not in acute distress.    Appearance: Normal appearance. He is well-developed.  HENT:     Head: Normocephalic and atraumatic.  Eyes:     General: No scleral icterus.       Right eye: No discharge.        Left eye: No discharge.  Cardiovascular:     Rate and Rhythm: Normal rate and regular rhythm.     Heart sounds: No murmur heard. Pulmonary:     Effort: Pulmonary effort is normal. No respiratory distress.     Breath sounds: Normal breath sounds.  Musculoskeletal:        General: Normal range of motion.     Cervical back: Normal range of motion and neck supple.     Right lower leg: No edema.     Left lower leg: No edema.  Skin:    General: Skin is warm and dry.  Neurological:     Mental Status: He is alert and oriented to person, place, and time.  Psychiatric:        Mood and Affect: Mood normal.        Behavior: Behavior normal.        Thought Content: Thought content normal.        Judgment: Judgment normal.     BP 132/88 (BP Location: Left Arm, Patient Position: Sitting, Cuff Size: Large)   Pulse 87   Temp 98 F (36.7 C) (Oral)   Resp 18   Ht 6' 2 (1.88 m)   Wt 294 lb 6.4 oz (133.5 kg)   SpO2 98%   BMI 37.80 kg/m  Wt Readings from Last 3 Encounters:  07/13/24 294 lb 6.4 oz (133.5 kg)  02/21/24 281 lb 6.4  oz (127.6 kg)  12/30/23 291 lb 9.6 oz (132.3 kg)    Diabetic Foot Exam - Simple   No data filed    Lab Results  Component Value Date   WBC 5.5 12/30/2023   HGB 16.3 12/30/2023   HCT 48.6 12/30/2023   PLT 217.0 12/30/2023   GLUCOSE 96 12/30/2023   CHOL 203 (H) 12/30/2023   TRIG 145.0 12/30/2023   HDL 37.30 (L) 12/30/2023   LDLDIRECT 160.0 02/11/2023   LDLCALC 136 (H) 12/30/2023   ALT 27 12/30/2023   AST 16 12/30/2023   NA 138 12/30/2023   K 4.8 12/30/2023   CL 102 12/30/2023   CREATININE 1.18 12/30/2023   BUN 15 12/30/2023   CO2 28 12/30/2023   TSH 1.52 12/30/2023   PSA 0.80 12/30/2023    Lab Results  Component Value Date   TSH 1.52 12/30/2023   Lab Results  Component Value Date   WBC 5.5 12/30/2023   HGB 16.3 12/30/2023   HCT 48.6 12/30/2023   MCV 91.1 12/30/2023   PLT 217.0 12/30/2023   Lab Results  Component Value Date   NA 138 12/30/2023   K 4.8 12/30/2023   CO2 28 12/30/2023   GLUCOSE 96 12/30/2023   BUN 15 12/30/2023   CREATININE 1.18 12/30/2023   BILITOT 0.8 12/30/2023   ALKPHOS 70 12/30/2023   AST 16 12/30/2023   ALT 27 12/30/2023   PROT 7.1 12/30/2023   ALBUMIN 4.5 12/30/2023   CALCIUM  9.7 12/30/2023   ANIONGAP 11 09/22/2015   GFR 74.63 12/30/2023   Lab Results  Component Value Date   CHOL 203 (H) 12/30/2023   Lab Results  Component Value Date  HDL 37.30 (L) 12/30/2023   Lab Results  Component Value Date   LDLCALC 136 (H) 12/30/2023   Lab Results  Component Value Date   TRIG 145.0 12/30/2023   Lab Results  Component Value Date   CHOLHDL 5 12/30/2023   No results found for: HGBA1C     Assessment & Plan:  Primary hypertension Assessment & Plan: Repeat bp was 138/88 Dash diet  Pt will start exercising  F/u 3 months or sooner as needed   Orders: -     CBC with Differential/Platelet -     Comprehensive metabolic panel with GFR -     Lipid panel -     TSH -     hydroCHLOROthiazide ; Take 1 capsule (12.5 mg total) by  mouth daily.  Dispense: 90 capsule; Refill: 3  Depression with anxiety -     TSH  Hyperlipidemia, unspecified hyperlipidemia type Assessment & Plan: Encourage heart healthy diet such as MIND or DASH diet, increase exercise, avoid trans fats, simple carbohydrates and processed foods, consider a krill or fish or flaxseed oil cap daily.    Orders: -     CBC with Differential/Platelet -     Comprehensive metabolic panel with GFR -     Lipid panel  Generalized anxiety disorder Assessment & Plan: Under a lot of stress right now Con't paxil      Assessment and Plan Assessment & Plan Primary hypertension   Blood pressure is elevated today, likely due to anxiety. Home readings are typically in the 130s/80s, which is acceptable but not optimal. Current reading is 132/98, improved from the initial reading. Target is 120/80. Switch blood pressure medication to the morning. Monitor blood pressure at home and report if consistently high. Recheck in three months unless home readings indicate earlier intervention.  Depression with anxiety   Anxiety spike noted today, contributing to elevated blood pressure. Stressors include work-related training and concerns about a pet's health. No medication adjustments discussed.  c Jamee JONELLE Antonio Cyndee, DO  "

## 2024-07-13 NOTE — Patient Instructions (Signed)

## 2024-07-13 NOTE — Assessment & Plan Note (Signed)
 Encourage heart healthy diet such as MIND or DASH diet, increase exercise, avoid trans fats, simple carbohydrates and processed foods, consider a krill or fish or flaxseed oil cap daily.

## 2024-07-13 NOTE — Assessment & Plan Note (Signed)
 Under a lot of stress right now Con't paxil 

## 2024-07-19 ENCOUNTER — Ambulatory Visit: Payer: Self-pay | Admitting: Family Medicine

## 2024-08-17 ENCOUNTER — Ambulatory Visit: Admitting: Family Medicine
